# Patient Record
Sex: Female | Born: 1980 | Race: White | Hispanic: No | Marital: Married | Smoking: Never smoker
Health system: Southern US, Community
[De-identification: ages and names within clinical notes are randomized; demographics above are authoritative.]

## PROBLEM LIST (undated history)

## (undated) DIAGNOSIS — F5104 Psychophysiologic insomnia: Secondary | ICD-10-CM

## (undated) DIAGNOSIS — J45909 Unspecified asthma, uncomplicated: Secondary | ICD-10-CM

## (undated) DIAGNOSIS — G43019 Migraine without aura, intractable, without status migrainosus: Secondary | ICD-10-CM

## (undated) DIAGNOSIS — F329 Major depressive disorder, single episode, unspecified: Secondary | ICD-10-CM

## (undated) DIAGNOSIS — F32A Depression, unspecified: Secondary | ICD-10-CM

## (undated) DIAGNOSIS — E7800 Pure hypercholesterolemia, unspecified: Secondary | ICD-10-CM

## (undated) DIAGNOSIS — B009 Herpesviral infection, unspecified: Secondary | ICD-10-CM

## (undated) DIAGNOSIS — E039 Hypothyroidism, unspecified: Secondary | ICD-10-CM

## (undated) HISTORY — PX: DILATION AND CURETTAGE OF UTERUS: SHX78

## (undated) HISTORY — DX: Psychophysiologic insomnia: F51.04

## (undated) HISTORY — DX: Herpesviral infection, unspecified: B00.9

## (undated) HISTORY — DX: Major depressive disorder, single episode, unspecified: F32.9

## (undated) HISTORY — DX: Unspecified asthma, uncomplicated: J45.909

## (undated) HISTORY — DX: Depression, unspecified: F32.A

## (undated) HISTORY — DX: Pure hypercholesterolemia, unspecified: E78.00

## (undated) HISTORY — DX: Migraine without aura, intractable, without status migrainosus: G43.019

## (undated) HISTORY — PX: OTHER SURGICAL HISTORY: SHX169

## (undated) HISTORY — DX: Hypothyroidism, unspecified: E03.9

---

## 2016-01-30 DIAGNOSIS — Z79899 Other long term (current) drug therapy: Secondary | ICD-10-CM | POA: Diagnosis not present

## 2016-01-30 DIAGNOSIS — B009 Herpesviral infection, unspecified: Secondary | ICD-10-CM | POA: Diagnosis not present

## 2016-01-30 DIAGNOSIS — O9852 Other viral diseases complicating childbirth: Secondary | ICD-10-CM | POA: Diagnosis not present

## 2016-01-30 DIAGNOSIS — Z3483 Encounter for supervision of other normal pregnancy, third trimester: Secondary | ICD-10-CM | POA: Diagnosis not present

## 2016-01-30 DIAGNOSIS — Z3A41 41 weeks gestation of pregnancy: Secondary | ICD-10-CM | POA: Diagnosis not present

## 2016-01-30 DIAGNOSIS — N898 Other specified noninflammatory disorders of vagina: Secondary | ICD-10-CM | POA: Diagnosis not present

## 2016-01-31 DIAGNOSIS — Z3A41 41 weeks gestation of pregnancy: Secondary | ICD-10-CM | POA: Diagnosis not present

## 2016-04-19 DIAGNOSIS — R300 Dysuria: Secondary | ICD-10-CM | POA: Diagnosis not present

## 2016-04-19 DIAGNOSIS — M2550 Pain in unspecified joint: Secondary | ICD-10-CM | POA: Diagnosis not present

## 2016-04-19 DIAGNOSIS — E039 Hypothyroidism, unspecified: Secondary | ICD-10-CM | POA: Diagnosis not present

## 2016-04-19 DIAGNOSIS — N898 Other specified noninflammatory disorders of vagina: Secondary | ICD-10-CM | POA: Diagnosis not present

## 2016-10-02 DIAGNOSIS — J329 Chronic sinusitis, unspecified: Secondary | ICD-10-CM | POA: Diagnosis not present

## 2017-03-13 DIAGNOSIS — L700 Acne vulgaris: Secondary | ICD-10-CM | POA: Diagnosis not present

## 2017-05-21 DIAGNOSIS — R0789 Other chest pain: Secondary | ICD-10-CM | POA: Diagnosis not present

## 2017-05-21 DIAGNOSIS — R079 Chest pain, unspecified: Secondary | ICD-10-CM | POA: Diagnosis not present

## 2017-05-29 DIAGNOSIS — Z6831 Body mass index (BMI) 31.0-31.9, adult: Secondary | ICD-10-CM | POA: Diagnosis not present

## 2017-05-29 DIAGNOSIS — N200 Calculus of kidney: Secondary | ICD-10-CM | POA: Diagnosis not present

## 2017-05-29 DIAGNOSIS — L219 Seborrheic dermatitis, unspecified: Secondary | ICD-10-CM | POA: Diagnosis not present

## 2017-06-03 DIAGNOSIS — M549 Dorsalgia, unspecified: Secondary | ICD-10-CM | POA: Diagnosis not present

## 2017-06-03 DIAGNOSIS — N200 Calculus of kidney: Secondary | ICD-10-CM | POA: Diagnosis not present

## 2017-06-03 DIAGNOSIS — N898 Other specified noninflammatory disorders of vagina: Secondary | ICD-10-CM | POA: Diagnosis not present

## 2018-07-20 DIAGNOSIS — Z1331 Encounter for screening for depression: Secondary | ICD-10-CM | POA: Diagnosis not present

## 2018-07-20 DIAGNOSIS — Z1339 Encounter for screening examination for other mental health and behavioral disorders: Secondary | ICD-10-CM | POA: Diagnosis not present

## 2018-07-20 DIAGNOSIS — Z23 Encounter for immunization: Secondary | ICD-10-CM | POA: Diagnosis not present

## 2018-07-20 DIAGNOSIS — R51 Headache: Secondary | ICD-10-CM | POA: Diagnosis not present

## 2018-07-29 DIAGNOSIS — R51 Headache: Secondary | ICD-10-CM | POA: Diagnosis not present

## 2018-07-29 DIAGNOSIS — H538 Other visual disturbances: Secondary | ICD-10-CM | POA: Diagnosis not present

## 2018-09-23 ENCOUNTER — Encounter: Payer: Self-pay | Admitting: Neurology

## 2018-09-23 ENCOUNTER — Ambulatory Visit: Payer: BLUE CROSS/BLUE SHIELD | Admitting: Neurology

## 2018-09-23 VITALS — BP 129/90 | HR 97 | Ht 66.0 in | Wt 207.0 lb

## 2018-09-23 DIAGNOSIS — G43019 Migraine without aura, intractable, without status migrainosus: Secondary | ICD-10-CM | POA: Insufficient documentation

## 2018-09-23 DIAGNOSIS — R9089 Other abnormal findings on diagnostic imaging of central nervous system: Secondary | ICD-10-CM | POA: Diagnosis not present

## 2018-09-23 DIAGNOSIS — E039 Hypothyroidism, unspecified: Secondary | ICD-10-CM

## 2018-09-23 DIAGNOSIS — F5104 Psychophysiologic insomnia: Secondary | ICD-10-CM | POA: Insufficient documentation

## 2018-09-23 HISTORY — DX: Psychophysiologic insomnia: F51.04

## 2018-09-23 HISTORY — DX: Migraine without aura, intractable, without status migrainosus: G43.019

## 2018-09-23 MED ORDER — NORTRIPTYLINE HCL 10 MG PO CAPS: ORAL_CAPSULE | ORAL | 3 refills | Status: DC

## 2018-09-23 NOTE — Progress Notes (Signed)
Reason for visit: Headache, abnormal MRI brain  Referring physician: Dr. Delaney MeigsHolt  Crystal Heath is a 38 y.o. female  History of present illness:  Crystal Heath is a 38 year old right-handed white female with a history of headaches that began in her 8220s.  The patient has had occasional headaches occurring once or twice a week, but in August 2019 her headaches increased in frequency and now are daily in nature.  The headaches are frontotemporal in nature associated with a pressure sensation, she may also have some neck discomfort.  The patient does report some phonophobia, but not much photophobia.  She occasionally will have nausea when the headache is severe.  She does not know of any particular activating factors.  Her mother and her brother also have a history of headache.  The patient does note some blurring of vision, she denies any numbness or weakness of the extremities or the face or body.  She denies any balance problems or difficulty controlling the bowels or the bladder.  The patient drinks 1 or 2 cups of coffee daily, she may have up to 4 caffeinated soft drinks daily, and she may occasionally drink tea.  The patient has chronic insomnia, this has continued.  The patient does have fatigue during the day.  MRI of the brain was done and showed widespread white matter changes that were nonspecific in nature.  The patient is sent to this office for an evaluation.  The disc of the MRI is not available for my review.  This [facility].  Past Medical History:  Diagnosis Date  . Asthma   . Depression   . High cholesterol   . HSV-2 infection   . Hypothyroidism     Past Surgical History:  Procedure Laterality Date  . DILATION AND CURETTAGE OF UTERUS    . episiotomy repair      Family History  Problem Relation Age of Onset  . Skin cancer Mother   . Hypertension Mother   . High Cholesterol Mother   . High blood pressure Father   . High Cholesterol Father   .  Congestive Heart Failure Maternal Grandmother   . Breast cancer Paternal Grandmother   . Alzheimer's disease Paternal Grandmother     Social history:  reports that she has never smoked. She has never used smokeless tobacco. She reports current alcohol use. She reports that she does not use drugs.  Medications:  Prior to Admission medications   Medication Sig Start Date End Date Taking? Authorizing Provider  levothyroxine (SYNTHROID, LEVOTHROID) 112 MCG tablet Take by mouth. 01/16/16  Yes [provider]  valACYclovir (VALTREX) 500 MG tablet Take 500 mg by mouth 2 (two) times daily.   Yes [provider]     No Known Allergies  ROS:  Out of a complete 14 system review of symptoms, the patient complains only of the following symptoms, and all other reviewed systems are negative.  Fatigue Chest pain Ringing in the ears, dizziness Blurred vision Confusion, headache, weakness Depression, anxiety, decreased energy, disinterest in activities, suicidal thoughts, racing thoughts Insomnia, sleepiness  Blood pressure 129/90, pulse 97, height 5\' 6"  (1.676 m), weight 207 lb (93.9 kg).  Physical Exam  General: The patient is alert and cooperative at the time of the examination.  The patient is moderately obese.  Eyes: Pupils are equal, round, and reactive to light. Discs are flat bilaterally.  Neck: The neck is supple, no carotid bruits are noted.  Respiratory: The respiratory  examination is clear.  Cardiovascular: The cardiovascular examination reveals a regular rate and rhythm, no obvious murmurs or rubs are noted.  Neuromuscular: Range of movement of the cervical spine is full.  No crepitus is noted in the temporomandibular joints.  Skin: Extremities are without significant edema.  Neurologic Exam  Mental status: The patient is alert and oriented x 3 at the time of the examination. The patient has apparent normal recent and remote memory, with an apparently  normal attention span and concentration ability.  Cranial nerves: Facial symmetry is present. There is good sensation of the face to pinprick and soft touch bilaterally. The strength of the facial muscles and the muscles to head turning and shoulder shrug are normal bilaterally. Speech is well enunciated, no aphasia or dysarthria is noted. Extraocular movements are full. Visual fields are full. The tongue is midline, and the patient has symmetric elevation of the soft palate. No obvious hearing deficits are noted.  Motor: The motor testing reveals 5 over 5 strength of all 4 extremities. Good symmetric motor tone is noted throughout.  Sensory: Sensory testing is intact to pinprick, soft touch, vibration sensation, and position sense on all 4 extremities. No evidence of extinction is noted.  Coordination: Cerebellar testing reveals good finger-nose-finger and heel-to-shin bilaterally.  Gait and station: Gait is normal. Tandem gait is normal. Romberg is negative. No drift is seen.  Reflexes: Deep tendon reflexes are symmetric and normal bilaterally. Toes are downgoing bilaterally.   Assessment/Plan:  1.  Chronic daily headache  The patient indicates that she stopped her thyroid medication about 1 year ago, we will need to check blood work today to evaluate this.  The patient may have converted migraine or a mixture of migraine and muscle tension headache.  The patient will be started on nortriptyline at night, working up to 30 mg daily dose.  The MRI of the brain was abnormal, I will need to get a disc of the scan for my review.  The patient will follow-up in about 4 months.  She will call for any dose adjustments of her medication.  She takes Tylenol or Advil for the headache which seems to be helpful.   Marlan Palau MD 09/23/2018 2:06 PM  [facility] [address] [place], [place] [address]  Phone [phone] Fax [phone]

## 2018-09-25 LAB — COMPREHENSIVE METABOLIC PANEL
ALT: 6 IU/L (ref 0–32)
AST: 15 IU/L (ref 0–40)
Albumin/Globulin Ratio: 1.4 (ref 1.2–2.2)
Albumin: 4.1 g/dL (ref 3.8–4.8)
Alkaline Phosphatase: 82 IU/L (ref 39–117)
BUN / CREAT RATIO: 13 (ref 9–23)
BUN: 11 mg/dL (ref 6–20)
Bilirubin Total: 0.2 mg/dL (ref 0.0–1.2)
CALCIUM: 9.8 mg/dL (ref 8.7–10.2)
CO2: 23 mmol/L (ref 20–29)
Chloride: 101 mmol/L (ref 96–106)
Creatinine, Ser: 0.82 mg/dL (ref 0.57–1.00)
GFR calc non Af Amer: 92 mL/min/{1.73_m2} (ref >59)
GFR, EST AFRICAN AMERICAN: 106 mL/min/{1.73_m2} (ref >59)
Globulin, Total: 3 g/dL (ref 1.5–4.5)
Glucose: 87 mg/dL (ref 65–99)
Potassium: 4.3 mmol/L (ref 3.5–5.2)
Sodium: 139 mmol/L (ref 134–144)
TOTAL PROTEIN: 7.1 g/dL (ref 6.0–8.5)

## 2018-09-25 LAB — SEDIMENTATION RATE: Sed Rate: 7 mm/hr (ref 0–32)

## 2018-09-25 LAB — CBC WITH DIFFERENTIAL/PLATELET
BASOS: 1 %
Basophils Absolute: 0.1 10*3/uL (ref 0.0–0.2)
EOS (ABSOLUTE): 0.2 10*3/uL (ref 0.0–0.4)
Eos: 2 %
Hematocrit: 39.5 % (ref 34.0–46.6)
Hemoglobin: 13.1 g/dL (ref 11.1–15.9)
Immature Grans (Abs): 0 10*3/uL (ref 0.0–0.1)
Immature Granulocytes: 0 %
LYMPHS ABS: 2.9 10*3/uL (ref 0.7–3.1)
Lymphs: 29 %
MCH: 28 pg (ref 26.6–33.0)
MCHC: 33.2 g/dL (ref 31.5–35.7)
MCV: 84 fL (ref 79–97)
Monocytes Absolute: 0.7 10*3/uL (ref 0.1–0.9)
Monocytes: 7 %
Neutrophils Absolute: 6.3 10*3/uL (ref 1.4–7.0)
Neutrophils: 61 %
Platelets: 335 10*3/uL (ref 150–450)
RBC: 4.68 x10E6/uL (ref 3.77–5.28)
RDW: 13.5 % (ref 11.7–15.4)
WBC: 10.1 10*3/uL (ref 3.4–10.8)

## 2018-09-25 LAB — RPR: RPR Ser Ql: NONREACTIVE

## 2018-09-25 LAB — TSH: TSH: 2.37 u[IU]/mL (ref 0.450–4.500)

## 2018-09-25 LAB — HIV ANTIBODY (ROUTINE TESTING W REFLEX): HIV Screen 4th Generation wRfx: NONREACTIVE

## 2018-09-25 LAB — ANGIOTENSIN CONVERTING ENZYME: Angio Convert Enzyme: 29 U/L (ref 14–82)

## 2018-09-25 LAB — PAN-ANCA
ANCA Proteinase 3: <3.5 U/mL (ref 0.0–3.5)
Atypical pANCA: <1:20 {titer}
C-ANCA: <1:20 {titer}
Myeloperoxidase Ab: <9 U/mL (ref 0.0–9.0)
P-ANCA: <1:20 {titer}

## 2018-09-25 LAB — B. BURGDORFI ANTIBODIES: Lyme IgG/IgM Ab: <0.91 {ISR} (ref 0.00–0.90)

## 2018-09-25 LAB — ANA W/REFLEX: Anti Nuclear Antibody(ANA): NEGATIVE

## 2018-09-29 ENCOUNTER — Telehealth: Payer: Self-pay

## 2018-09-29 NOTE — Telephone Encounter (Signed)
I contacted the pt and was able to review lab results per Dr. Anne Hahn. Pt verbalized understanding and requested TSH results to be fwd to PCP (Dr. Leonor Liv at [facility]) TSH result faxed to [phone]. Confirmation received.

## 2018-09-29 NOTE — Telephone Encounter (Signed)
-----   Message from York Spaniel, MD sent at 09/26/2018  9:47 AM EST -----  The blood work results are unremarkable. Please call the patient. ----- Message ----- From: Nell Range Lab Results In Sent: 09/24/2018   7:38 AM EST To: York Spaniel, MD

## 2018-10-21 ENCOUNTER — Encounter: Payer: Self-pay | Admitting: Neurology

## 2018-12-22 ENCOUNTER — Telehealth: Payer: Self-pay

## 2018-12-22 NOTE — Telephone Encounter (Signed)
Unable to get in contact with the patient to offer her a sooner appt with Margie Ege, NP for 12/23/2018. I left a voicemail asking her to return my call. Office number was provided.

## 2018-12-31 ENCOUNTER — Telehealth: Payer: Self-pay

## 2018-12-31 NOTE — Telephone Encounter (Signed)
Unable to get in contact with the patient to offer her a sooner appt with Maralyn Sago. I left a voicemail asking her to return my call. Office number was provided.  If patient calls back please offer her an appt for the week of May 11-14. Maralyn Sago will be out of the office the last week of this month.

## 2019-01-19 ENCOUNTER — Ambulatory Visit: Payer: BLUE CROSS/BLUE SHIELD | Admitting: Neurology

## 2019-01-22 ENCOUNTER — Ambulatory Visit: Payer: BLUE CROSS/BLUE SHIELD | Admitting: Neurology

## 2019-02-03 ENCOUNTER — Telehealth: Payer: Self-pay | Admitting: *Deleted

## 2019-02-03 ENCOUNTER — Other Ambulatory Visit: Payer: Self-pay | Admitting: Neurology

## 2019-02-03 NOTE — Telephone Encounter (Signed)
Due to current COVID 19 pandemic, our office is severely reducing in office visits until further notice, in order to minimize the risk to our patients and healthcare providers. Unable to get in contact with the patient to convert their office visit with Sarah on 02/08/2019 into a doxy.me visit. I left a voicemail asking the patient to return my call. Office number was provided.     If patient calls back please convert their office visit into a doxy.me visit.      

## 2019-02-05 NOTE — Telephone Encounter (Signed)
Pt returned call and consented to a Virtual Visit. Link was sent to [phone] [employer]  Pt understands that although there may be some limitations with this type of visit, we will take all precautions to reduce any security or privacy concerns.  Pt understands that this will be treated like an in office visit and we will file with pt's insurance, and there may be a patient responsible charge related to this service.

## 2019-02-07 NOTE — Progress Notes (Signed)
Virtual Visit via Video Note  I connected with Crystal Heath on 02/08/19 at  8:15 AM EDT by a video enabled telemedicine application and verified that I am speaking with the correct person using two identifiers.  Location: Patient: At her home  Provider: In the office    I discussed the limitations of evaluation and management by telemedicine and the availability of in person appointments. The patient expressed understanding and agreed to proceed.  History of Present Illness: 02/07/2019 SS: [name] is a 38 year old female with history of migraine headaches and abnormal MRI of the brain.  She is currently taking nortriptyline 30 mg at bedtime.  She had [facility] showing widespread white matter changes, nonspecific in nature, in November 2019.  She reports good improvement in her headaches, more than 80%.  She reports since her last visit in January 2020, she has only had 4-5 headaches.  She describes them as frontotemporal in location, she will take 2 Tylenol with relief.  She indicates she has stopped taking her Synthroid.  She says over the last several weeks she has made lifestyle changes to include, eating healthier, consuming less caffeine, drinking more water, exercising daily.  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   09/23/2018 Dr. Anne HahnWillis : [name] is a 38 year old right-handed white female with a history of headaches that began in her 1820s.  The patient has had occasional headaches occurring once or twice a week, but in August 2019 her headaches increased in frequency and now are daily in nature.  The headaches are frontotemporal in nature associated with a pressure sensation, she may also have some neck discomfort.  The patient does report some phonophobia, but not much photophobia.  She occasionally will have nausea when the headache is severe.  She does not know of any particular activating factors.  Her mother and her brother also have a  history of headache.  The patient does note some blurring of vision, she denies any numbness or weakness of the extremities or the face or body.  She denies any balance problems or difficulty controlling the bowels or the bladder.  The patient drinks 1 or 2 cups of coffee daily, she may have up to 4 caffeinated soft drinks daily, and she may occasionally drink tea.  The patient has chronic insomnia, this has continued.  The patient does have fatigue during the day. MRI of the brain was done and showed widespread white matter changes that were nonspecific in nature.  The patient is sent to this office for an evaluation.  The disc of the MRI is not available for my review.  This was done through [facility].   Observations/Objective: Alert, answers questions appropriately, follows commands, speech is clear and concise, facial symmetry noted, symmetric eye movements, no arm drift, gait is intact  Assessment and Plan: 1.  Migraine headaches 2.  Abnormal MRI of the brain in November 2019  She indicates excellent control of her headaches, has only had 4-5 headaches in the last several months.  She would like to try to wean off nortriptyline.  She has made several lifestyle changes, she wonders if this may improve her headache control without medications.  Over the next several weeks, she will slowly decrease her nortriptyline dose.  If her headaches return, she can certainly restart her nortriptyline 30 mg at bedtime.  I will try to get the disc of her MRI of the brain that was done  in November 2019 at [facility] for Dr. Tobey Grim review.  I have suggested she contact her PCP for ongoing management of hypothyroidism, her TSH in January 2020 was WNL, she has stopped Synthroid.   Follow Up Instructions: 6 months 08/11/2019 9:15 am   I discussed the assessment and treatment plan with the patient. The patient was provided an opportunity to ask questions and all were answered. The patient agreed with  the plan and demonstrated an understanding of the instructions.   The patient was advised to call back or seek an in-person evaluation if the symptoms worsen or if the condition fails to improve as anticipated.  I provided 15 minutes of non-face-to-face time during this encounter.   Evangeline Dakin, DNP  [facility] [address], [place], [place] [address] [phone]

## 2019-02-08 ENCOUNTER — Ambulatory Visit (INDEPENDENT_AMBULATORY_CARE_PROVIDER_SITE_OTHER): Payer: BC Managed Care – PPO | Admitting: Neurology

## 2019-02-08 ENCOUNTER — Encounter: Payer: Self-pay | Admitting: Neurology

## 2019-02-08 ENCOUNTER — Other Ambulatory Visit: Payer: Self-pay

## 2019-02-08 DIAGNOSIS — G43019 Migraine without aura, intractable, without status migrainosus: Secondary | ICD-10-CM | POA: Diagnosis not present

## 2019-02-08 DIAGNOSIS — R9089 Other abnormal findings on diagnostic imaging of central nervous system: Secondary | ICD-10-CM | POA: Diagnosis not present

## 2019-02-08 NOTE — Telephone Encounter (Signed)
Noted  

## 2019-02-08 NOTE — Progress Notes (Signed)
I have read the note, and I agree with the clinical assessment and plan.  Veroncia Jezek K Orva Riles   

## 2019-03-10 DIAGNOSIS — Z13220 Encounter for screening for lipoid disorders: Secondary | ICD-10-CM | POA: Diagnosis not present

## 2019-03-10 DIAGNOSIS — Z6828 Body mass index (BMI) 28.0-28.9, adult: Secondary | ICD-10-CM | POA: Diagnosis not present

## 2019-03-10 DIAGNOSIS — Z1331 Encounter for screening for depression: Secondary | ICD-10-CM | POA: Diagnosis not present

## 2019-03-10 DIAGNOSIS — Z0000 Encounter for general adult medical examination without abnormal findings: Secondary | ICD-10-CM | POA: Diagnosis not present

## 2019-03-10 DIAGNOSIS — E079 Disorder of thyroid, unspecified: Secondary | ICD-10-CM | POA: Diagnosis not present

## 2019-03-11 DIAGNOSIS — E079 Disorder of thyroid, unspecified: Secondary | ICD-10-CM | POA: Diagnosis not present

## 2019-07-17 ENCOUNTER — Other Ambulatory Visit: Payer: Self-pay | Admitting: Neurology

## 2019-07-19 ENCOUNTER — Other Ambulatory Visit: Payer: Self-pay

## 2019-07-19 MED ORDER — NORTRIPTYLINE HCL 10 MG PO CAPS: ORAL_CAPSULE | ORAL | 0 refills | Status: DC

## 2019-08-11 ENCOUNTER — Ambulatory Visit: Payer: BC Managed Care – PPO | Admitting: Neurology

## 2019-08-11 ENCOUNTER — Encounter: Payer: Self-pay | Admitting: Neurology

## 2019-08-11 ENCOUNTER — Other Ambulatory Visit: Payer: Self-pay

## 2019-08-11 VITALS — BP 115/70 | HR 99 | Temp 97.3°F | Ht 67.0 in | Wt 193.8 lb

## 2019-08-11 DIAGNOSIS — G43019 Migraine without aura, intractable, without status migrainosus: Secondary | ICD-10-CM | POA: Diagnosis not present

## 2019-08-11 DIAGNOSIS — R9089 Other abnormal findings on diagnostic imaging of central nervous system: Secondary | ICD-10-CM | POA: Diagnosis not present

## 2019-08-11 MED ORDER — NORTRIPTYLINE HCL 10 MG PO CAPS: ORAL_CAPSULE | ORAL | 11 refills | Status: DC

## 2019-08-11 NOTE — Patient Instructions (Signed)
Continue the nortriptyline 30 mg at bedtime   Please try to drink more water, healthy eating, exercise for migraine prevention   I will try to get the disc for the MRI   Return in 6 months

## 2019-08-11 NOTE — Progress Notes (Signed)
PATIENT: Crystal BurgessStacy Ferrall DOB: 1980-09-22  REASON FOR VISIT: follow up HISTORY FROM: patient  HISTORY OF PRESENT ILLNESS: Today 08/11/19  08/11/2019 SS: Crystal Heath is a 38 year old female with history of migraine headache and abnormal MRI of the brain.  She had [facility] showed widespread white matter changes, nonspecific in nature in November 2019.  She reports she tried to taper off nortriptyline, but had a recurrence of headache.  She is not sure if this was related to a dose reduction, or poor life style habits.  Nonetheless, she has resumed nortriptyline 30 mg at bedtime.  She admits she is not drinking enough water, or making good eating choices, she is drinking a lot of caffeine and soda.  She reports 2-3 headaches a week, she is able to continue her activity.  She says she may take ibuprofen once a week for headache, with relief.  She continues to complain of some word finding difficulty, mild confusion at times.  She had follow-up with her primary doctor, is now taking Synthroid.  She indicates overall her health has been good.  She has seen her eye doctor, told her exam was normal.  She presents today for evaluation unaccompanied.  HISTORY  02/07/2019 SS: Crystal Heath is a 38 year old female with history of migraine headaches and abnormal MRI of the brain.  She is currently taking nortriptyline 30 mg at bedtime.  She had [facility] showing widespread white matter changes, nonspecific in nature, in November 2019.  She reports good improvement in her headaches, more than 80%.  She reports since her last visit in January 2020, she has only had 4-5 headaches.  She describes them as frontotemporal in location, she will take 2 Tylenol with relief.  She indicates she has stopped taking her Synthroid.  She says over the last several weeks she has made lifestyle changes to include, eating healthier, consuming less caffeine, drinking more  water, exercising daily.  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   REVIEW OF SYSTEMS: Out of a complete 14 system review of symptoms, the patient complains only of the following symptoms, and all other reviewed systems are negative.  Headache  ALLERGIES: No Known Allergies  HOME MEDICATIONS: Outpatient Medications Prior to Visit  Medication Sig Dispense Refill  . levothyroxine (SYNTHROID) 50 MCG tablet Take 50 mcg by mouth daily before breakfast.    . valACYclovir (VALTREX) 500 MG tablet Take 500 mg by mouth 2 (two) times daily.    . nortriptyline (PAMELOR) 10 MG capsule 3 CAPSULES BY MOUTH AT NIGHT 90 capsule 0  . levothyroxine (SYNTHROID, LEVOTHROID) 112 MCG tablet Take by mouth.     No facility-administered medications prior to visit.    PAST MEDICAL HISTORY: Past Medical History:  Diagnosis Date  . Asthma   . Chronic insomnia 09/23/2018  . Common migraine with intractable migraine 09/23/2018  . Depression   . High cholesterol   . HSV-2 infection   . Hypothyroidism     PAST SURGICAL HISTORY: Past Surgical History:  Procedure Laterality Date  . DILATION AND CURETTAGE OF UTERUS    . episiotomy repair      FAMILY HISTORY: Family History  Problem Relation Age of Onset  . Skin cancer Mother   . Hypertension Mother   . High Cholesterol Mother   . High blood pressure Father   . High Cholesterol Father   . Congestive Heart Failure Maternal Grandmother   .  Breast cancer Paternal Grandmother   . Alzheimer's disease Paternal Grandmother     SOCIAL HISTORY: Social History   Socioeconomic History  . Marital status: Married    Spouse name: Not on file  . Number of children: Not on file  . Years of education: Not on file  . Highest education level: Not on file  Occupational History  . Not on file  Tobacco Use  . Smoking status: Never Smoker  . Smokeless tobacco: Never Used  Substance and Sexual Activity  . Alcohol use: Yes    Comment: 1-2 per drinks per  week  . Drug use: Never  . Sexual activity: Not on file  Other Topics Concern  . Not on file  Social History Narrative   1-2 cups of caffeine daily    Lives at home with spouse and three children    Right handed    Social Determinants of Health   Financial Resource Strain:   . Difficulty of Paying Living Expenses: Not on file  Food Insecurity:   . Worried About Programme researcher, broadcasting/film/video in the Last Year: Not on file  . Ran Out of Food in the Last Year: Not on file  Transportation Needs:   . Lack of Transportation (Medical): Not on file  . Lack of Transportation (Non-Medical): Not on file  Physical Activity:   . Days of Exercise per Week: Not on file  . Minutes of Exercise per Session: Not on file  Stress:   . Feeling of Stress : Not on file  Social Connections:   . Frequency of Communication with Friends and Family: Not on file  . Frequency of Social Gatherings with Friends and Family: Not on file  . Attends Religious Services: Not on file  . Active Member of Clubs or Organizations: Not on file  . Attends Banker Meetings: Not on file  . Marital Status: Not on file  Intimate Partner Violence:   . Fear of Current or Ex-Partner: Not on file  . Emotionally Abused: Not on file  . Physically Abused: Not on file  . Sexually Abused: Not on file   PHYSICAL EXAM  Vitals:   08/11/19 1338  BP: 115/70  Pulse: 99  Temp: (!) 97.3 F (36.3 C)  Weight: 193 lb 12.8 oz (87.9 kg)  Height: 5\' 7"  (1.702 m)   Body mass index is 30.35 kg/m.  Generalized: Well developed, in no acute distress   Neurological examination  Mentation: Alert oriented to time, place, history taking. Follows all commands speech and language fluent Cranial nerve II-XII: Pupils were equal round reactive to light. Extraocular movements were full, visual field were full on confrontational test. Facial sensation and strength were normal. Head turning and shoulder shrug  were normal and symmetric. Motor:  The motor testing reveals 5 over 5 strength of all 4 extremities. Good symmetric motor tone is noted throughout.  Sensory: Sensory testing is intact to soft touch on all 4 extremities. No evidence of extinction is noted.  Coordination: Cerebellar testing reveals good finger-nose-finger and heel-to-shin bilaterally.  Gait and station: Gait is normal. Tandem gait is normal. Romberg is negative. No drift is seen.  Reflexes: Deep tendon reflexes are symmetric and normal bilaterally.   DIAGNOSTIC DATA (LABS, IMAGING, TESTING) - I reviewed patient records, labs, notes, testing and imaging myself where available.  Lab Results  Component Value Date   WBC 10.1 09/23/2018   HGB 13.1 09/23/2018   HCT 39.5 09/23/2018   MCV 84 09/23/2018  PLT 335 09/23/2018      Component Value Date/Time   NA 139 09/23/2018 1431   K 4.3 09/23/2018 1431   CL 101 09/23/2018 1431   CO2 23 09/23/2018 1431   GLUCOSE 87 09/23/2018 1431   BUN 11 09/23/2018 1431   CREATININE 0.82 09/23/2018 1431   CALCIUM 9.8 09/23/2018 1431   PROT 7.1 09/23/2018 1431   ALBUMIN 4.1 09/23/2018 1431   AST 15 09/23/2018 1431   ALT 6 09/23/2018 1431   ALKPHOS 82 09/23/2018 1431   BILITOT 0.2 09/23/2018 1431   GFRNONAA 92 09/23/2018 1431   GFRAA 106 09/23/2018 1431   No results found for: CHOL, HDL, LDLCALC, LDLDIRECT, TRIG, CHOLHDL No results found for: HGBA1C No results found for: VITAMINB12 Lab Results  Component Value Date   TSH 2.370 09/23/2018   ASSESSMENT AND PLAN 38 y.o. year old female  has a past medical history of Asthma, Chronic insomnia (09/23/2018), Common migraine with intractable migraine (09/23/2018), Depression, High cholesterol, HSV-2 infection, and Hypothyroidism. here with:  1.  Migraine headache 2.  Abnormal MRI of the brain in November 2019  She has had a recurrence of her headache, along with poor life style habits.  She will remain on nortriptyline 30 mg at bedtime. She will try to drink plenty of  water, make healthy eating choices, and exercise.  She is to avoid excess amounts of caffeine. She has found that healthy lifestyle management has been very beneficial for her headaches.  I will again try to get the CD of her MRI of the brain at [facility] in November 2019 for Dr. Jannifer Franklin to review.  MRI of the brain report indicated widespread white matter changes, nonspecific in nature. She does complain of some general confusion, word finding difficulty that may be related.  She will follow-up in 6 months or sooner if needed.  I did advise if her symptoms worsen or she develops any new symptoms she should let us know.  Medical records has faxed a request to [facility] to have them mail [place] a CD of the MRI of the brain.   I spent 15 minutes with the patient. 50% of this time was spent discussing her plan of care.  Butler Denmark, AGNP-C, DNP 08/11/2019, 2:03 PM [facility] [address], [place], [place] [address] [phone]

## 2019-08-12 NOTE — Progress Notes (Signed)
I have read the note, and I agree with the clinical assessment and plan.  Charles K Willis   

## 2019-08-31 ENCOUNTER — Telehealth: Payer: Self-pay | Admitting: Neurology

## 2019-08-31 NOTE — Telephone Encounter (Signed)
I received MRI scan CD from [place] for Dr. Anne Hahn to review. I will place in his box for non-urgent review.

## 2019-09-06 NOTE — Telephone Encounter (Signed)
I have reviewed the disc of the MRI of the brain, it does show mild to moderate white matter changes in the cortical white matter, no involvement of the brainstem or cerebellum.  The findings are nonspecific, could be potentially related to history of migraine, should be followed over time to exclude other issues such as demyelinating disease.

## 2019-09-07 NOTE — Telephone Encounter (Signed)
I called the patient and left a message with MRI findings, will repeat every 2 years.

## 2019-09-07 NOTE — Telephone Encounter (Signed)
Repeating the study in 2 years is reasonable.

## 2019-12-22 DIAGNOSIS — Z01419 Encounter for gynecological examination (general) (routine) without abnormal findings: Secondary | ICD-10-CM | POA: Diagnosis not present

## 2019-12-22 DIAGNOSIS — N898 Other specified noninflammatory disorders of vagina: Secondary | ICD-10-CM | POA: Diagnosis not present

## 2020-01-06 DIAGNOSIS — N898 Other specified noninflammatory disorders of vagina: Secondary | ICD-10-CM | POA: Diagnosis not present

## 2020-02-09 ENCOUNTER — Ambulatory Visit: Payer: BC Managed Care – PPO | Admitting: Neurology

## 2020-03-16 DIAGNOSIS — Z6832 Body mass index (BMI) 32.0-32.9, adult: Secondary | ICD-10-CM | POA: Diagnosis not present

## 2020-03-16 DIAGNOSIS — Z13220 Encounter for screening for lipoid disorders: Secondary | ICD-10-CM | POA: Diagnosis not present

## 2020-03-16 DIAGNOSIS — Z1331 Encounter for screening for depression: Secondary | ICD-10-CM | POA: Diagnosis not present

## 2020-03-16 DIAGNOSIS — Z0000 Encounter for general adult medical examination without abnormal findings: Secondary | ICD-10-CM | POA: Diagnosis not present

## 2020-03-16 DIAGNOSIS — E079 Disorder of thyroid, unspecified: Secondary | ICD-10-CM | POA: Diagnosis not present

## 2020-03-27 ENCOUNTER — Encounter: Payer: Self-pay | Admitting: Neurology

## 2020-03-27 ENCOUNTER — Ambulatory Visit: Payer: BC Managed Care – PPO | Admitting: Neurology

## 2020-03-27 VITALS — BP 134/98 | HR 102 | Ht 67.0 in | Wt 207.8 lb

## 2020-03-27 DIAGNOSIS — G43019 Migraine without aura, intractable, without status migrainosus: Secondary | ICD-10-CM | POA: Diagnosis not present

## 2020-03-27 DIAGNOSIS — R9089 Other abnormal findings on diagnostic imaging of central nervous system: Secondary | ICD-10-CM

## 2020-03-27 MED ORDER — NORTRIPTYLINE HCL 10 MG PO CAPS: ORAL_CAPSULE | ORAL | 11 refills | Status: DC

## 2020-03-27 NOTE — Progress Notes (Signed)
I have read the note, and I agree with the clinical assessment and plan.  Jamaree Hosier K Luellen Howson   

## 2020-03-27 NOTE — Patient Instructions (Signed)
Will order carotid US Continue nortriptyline at current dosing Check MRI at next visit  Return in 6 months or sooner if needed

## 2020-03-27 NOTE — Progress Notes (Signed)
PATIENT: Crystal Heath DOB: [birth year]  REASON FOR VISIT: follow up HISTORY FROM: patient  HISTORY OF PRESENT ILLNESS: Today 03/27/20  Crystal Heath is a 39 year old female with history of migraine headache and abnormal MRI of the brain.  MRI of the brain at [facility] showed widespread white matter changes, nonspecific in nature November 2019, will repeat in 2 years.  She remains on nortriptyline.  Headaches are currently well controlled, on average 1/week.  Tolerating nortriptyline.  Complains of pressure in her head when pressure is applied to her neck, mostly on the right side, such as pressing on her neck or from pillow.  No dizziness.  Causes "pressure all over her head".  Has started cholesterol medication. Tearful today, frustrated with herself for not taking better care of her health.  Presents today for follow-up unaccompanied.  HISTORY 08/11/2019 SS: [name] is a 39 year old female with history of migraine headache and abnormal MRI of the brain.  She had MRI of the brain done at [facility] showed widespread white matter changes, nonspecific in nature in November 2019.  She reports she tried to taper off nortriptyline, but had a recurrence of headache.  She is not sure if this was related to a dose reduction, or poor life style habits.  Nonetheless, she has resumed nortriptyline 30 mg at bedtime.  She admits she is not drinking enough water, or making good eating choices, she is drinking a lot of caffeine and soda.  She reports 2-3 headaches a week, she is able to continue her activity.  She says she may take ibuprofen once a week for headache, with relief.  She continues to complain of some word finding difficulty, mild confusion at times.  She had follow-up with her primary doctor, is now taking Synthroid.  She indicates overall her health has been good.  She has seen her eye doctor, told her exam was normal.  She presents today for evaluation unaccompanied.   REVIEW  OF SYSTEMS: Out of a complete 14 system review of symptoms, the patient complains only of the following symptoms, and all other reviewed systems are negative.  Headache  ALLERGIES: No Known Allergies  HOME MEDICATIONS: Outpatient Medications Prior to Visit  Medication Sig Dispense Refill   levothyroxine (SYNTHROID) 50 MCG tablet Take 50 mcg by mouth daily before breakfast.     rosuvastatin (CRESTOR) 10 MG tablet Take 10 mg by mouth at bedtime.     valACYclovir (VALTREX) 500 MG tablet Take 500 mg by mouth 2 (two) times daily.     nortriptyline (PAMELOR) 10 MG capsule 3 CAPSULES BY MOUTH AT NIGHT 90 capsule 11   No facility-administered medications prior to visit.    PAST MEDICAL HISTORY: Past Medical History:  Diagnosis Date   Asthma    Chronic insomnia 09/23/2018   Common migraine with intractable migraine 09/23/2018   Depression    High cholesterol    HSV-2 infection    Hypothyroidism     PAST SURGICAL HISTORY: Past Surgical History:  Procedure Laterality Date   DILATION AND CURETTAGE OF UTERUS     episiotomy repair      FAMILY HISTORY: Family History  Problem Relation Age of Onset   Skin cancer Mother    Hypertension Mother    High Cholesterol Mother    High blood pressure Father    High Cholesterol Father    Congestive Heart Failure Maternal Grandmother    Breast cancer Paternal Grandmother    Alzheimer's disease Paternal Grandmother  SOCIAL HISTORY: Social History   Socioeconomic History   Marital status: Married    Spouse name: Not on file   Number of children: Not on file   Years of education: Not on file   Highest education level: Not on file  Occupational History   Not on file  Tobacco Use   Smoking status: Never Smoker   Smokeless tobacco: Never Used  Substance and Sexual Activity   Alcohol use: Yes    Comment: 1-2 per drinks per week   Drug use: Never   Sexual activity: Not on file  Other Topics Concern    Not on file  Social History Narrative   1-2 cups of caffeine daily    Lives at home with spouse and three children    Right handed    Social Determinants of Health   Financial Resource Strain:    Difficulty of Paying Living Expenses:   Food Insecurity:    Worried About Programme researcher, broadcasting/film/video in the Last Year:    Barista in the Last Year:   Transportation Needs:    Freight forwarder (Medical):    Lack of Transportation (Non-Medical):   Physical Activity:    Days of Exercise per Week:    Minutes of Exercise per Session:   Stress:    Feeling of Stress :   Social Connections:    Frequency of Communication with Friends and Family:    Frequency of Social Gatherings with Friends and Family:    Attends Religious Services:    Active Member of Clubs or Organizations:    Attends Banker Meetings:    Marital Status:   Intimate Partner Violence:    Fear of Current or Ex-Partner:    Emotionally Abused:    Physically Abused:    Sexually Abused:    PHYSICAL EXAM  Vitals:   03/27/20 1337  BP: (!) 134/98  Pulse: (!) 102  Weight: 207 lb 12.8 oz (94.3 kg)  Height: 5\' 7"  (1.702 m)   Body mass index is 32.55 kg/m.  Generalized: Well developed, in no acute distress  No carotid bruits noted. Neurological examination  Mentation: Alert oriented to time, place, history taking. Follows all commands speech and language fluent Cranial nerve II-XII: Pupils were equal round reactive to light. Extraocular movements were full, visual field were full on confrontational test. Facial sensation and strength were normal.  Head turning and shoulder shrug  were normal and symmetric. Motor: The motor testing reveals 5 over 5 strength of all 4 extremities. Good symmetric motor tone is noted throughout.  Sensory: Sensory testing is intact to soft touch on all 4 extremities. No evidence of extinction is noted.  Coordination: Cerebellar testing reveals good  finger-nose-finger and heel-to-shin bilaterally.  Gait and station: Gait is normal. Tandem gait is normal. Romberg is negative. No drift is seen.  Reflexes: Deep tendon reflexes are symmetric and normal bilaterally.   DIAGNOSTIC DATA (LABS, IMAGING, TESTING) - I reviewed patient records, labs, notes, testing and imaging myself where available.  Lab Results  Component Value Date   WBC 10.1 09/23/2018   HGB 13.1 09/23/2018   HCT 39.5 09/23/2018   MCV 84 09/23/2018   PLT 335 09/23/2018      Component Value Date/Time   NA 139 09/23/2018 1431   K 4.3 09/23/2018 1431   CL 101 09/23/2018 1431   CO2 23 09/23/2018 1431   GLUCOSE 87 09/23/2018 1431   BUN 11 09/23/2018 1431   CREATININE  0.82 09/23/2018 1431   CALCIUM 9.8 09/23/2018 1431   PROT 7.1 09/23/2018 1431   ALBUMIN 4.1 09/23/2018 1431   AST 15 09/23/2018 1431   ALT 6 09/23/2018 1431   ALKPHOS 82 09/23/2018 1431   BILITOT 0.2 09/23/2018 1431   GFRNONAA 92 09/23/2018 1431   GFRAA 106 09/23/2018 1431   No results found for: CHOL, HDL, LDLCALC, LDLDIRECT, TRIG, CHOLHDL No results found for: RUEA5W No results found for: VITAMINB12 Lab Results  Component Value Date   TSH 2.370 09/23/2018      ASSESSMENT AND PLAN 39 y.o. year old female  has a past medical history of Asthma, Chronic insomnia (09/23/2018), Common migraine with intractable migraine (09/23/2018), Depression, High cholesterol, HSV-2 infection, and Hypothyroidism. here with:  1.  Migraine headache -Headaches well controlled -Continue nortriptyline 30 mg at bedtime  2.  Abnormal MRI of the brain in November 2019 -Diffuse white matter changes by MRI of the brain -Recheck in November 2021 (sent reminder) or next follow-up  3.  Neck pressure -Will order carotid ultrasound, no bruits on exam -Risk factors HTN, HLD -Encouraged her to establish a consistent exercise regimen, drink plenty of water, make healthy eating choices -Return in 6 months or sooner if  neede  I spent 30 minutes of face-to-face and non-face-to-face time with patient.  This included previsit chart review, lab review, study review, order entry, electronic health record documentation, patient education.  Margie Ege, AGNP-C, DNP 03/27/2020, 2:20 PM [facility] [address] [place], [place] [address] [phone]

## 2020-05-05 DIAGNOSIS — L219 Seborrheic dermatitis, unspecified: Secondary | ICD-10-CM | POA: Diagnosis not present

## 2020-05-05 DIAGNOSIS — D225 Melanocytic nevi of trunk: Secondary | ICD-10-CM | POA: Diagnosis not present

## 2020-05-05 DIAGNOSIS — D2372 Other benign neoplasm of skin of left lower limb, including hip: Secondary | ICD-10-CM | POA: Diagnosis not present

## 2020-05-05 DIAGNOSIS — L820 Inflamed seborrheic keratosis: Secondary | ICD-10-CM | POA: Diagnosis not present

## 2020-05-23 DIAGNOSIS — Z8249 Family history of ischemic heart disease and other diseases of the circulatory system: Secondary | ICD-10-CM | POA: Diagnosis not present

## 2020-05-23 DIAGNOSIS — R079 Chest pain, unspecified: Secondary | ICD-10-CM | POA: Diagnosis not present

## 2020-05-23 DIAGNOSIS — R002 Palpitations: Secondary | ICD-10-CM | POA: Diagnosis not present

## 2020-05-23 DIAGNOSIS — R030 Elevated blood-pressure reading, without diagnosis of hypertension: Secondary | ICD-10-CM | POA: Diagnosis not present

## 2020-05-23 DIAGNOSIS — E785 Hyperlipidemia, unspecified: Secondary | ICD-10-CM | POA: Diagnosis not present

## 2020-06-07 DIAGNOSIS — R079 Chest pain, unspecified: Secondary | ICD-10-CM | POA: Diagnosis not present

## 2020-06-15 DIAGNOSIS — R079 Chest pain, unspecified: Secondary | ICD-10-CM | POA: Diagnosis not present

## 2020-06-15 DIAGNOSIS — Z8249 Family history of ischemic heart disease and other diseases of the circulatory system: Secondary | ICD-10-CM | POA: Diagnosis not present

## 2020-06-19 DIAGNOSIS — R079 Chest pain, unspecified: Secondary | ICD-10-CM | POA: Diagnosis not present

## 2020-06-19 DIAGNOSIS — G43019 Migraine without aura, intractable, without status migrainosus: Secondary | ICD-10-CM | POA: Diagnosis not present

## 2020-06-19 DIAGNOSIS — F5104 Psychophysiologic insomnia: Secondary | ICD-10-CM | POA: Diagnosis not present

## 2020-06-19 DIAGNOSIS — E782 Mixed hyperlipidemia: Secondary | ICD-10-CM | POA: Diagnosis not present

## 2020-06-19 DIAGNOSIS — E039 Hypothyroidism, unspecified: Secondary | ICD-10-CM | POA: Diagnosis not present

## 2020-06-19 DIAGNOSIS — Z8249 Family history of ischemic heart disease and other diseases of the circulatory system: Secondary | ICD-10-CM | POA: Diagnosis not present

## 2020-06-22 DIAGNOSIS — R079 Chest pain, unspecified: Secondary | ICD-10-CM | POA: Diagnosis not present

## 2020-06-22 DIAGNOSIS — R002 Palpitations: Secondary | ICD-10-CM | POA: Diagnosis not present

## 2020-06-22 DIAGNOSIS — R030 Elevated blood-pressure reading, without diagnosis of hypertension: Secondary | ICD-10-CM | POA: Diagnosis not present

## 2020-06-22 DIAGNOSIS — E785 Hyperlipidemia, unspecified: Secondary | ICD-10-CM | POA: Diagnosis not present

## 2020-06-22 DIAGNOSIS — R071 Chest pain on breathing: Secondary | ICD-10-CM | POA: Diagnosis not present

## 2020-06-26 ENCOUNTER — Telehealth: Payer: Self-pay | Admitting: Neurology

## 2020-06-26 DIAGNOSIS — R519 Headache, unspecified: Secondary | ICD-10-CM

## 2020-06-26 NOTE — Telephone Encounter (Signed)
When last seen, we talked about repeating MRI of the brain around this time, check to see if she is ready. If so will order with contrast, check kidneys before contrast.  Dr. Anne Hahn looked at MRI of the brain without contrast disc in January 2021, showed mild to moderate white matter changes in the cortical white matter, no involvement of brainstem or cerebellum.  Findings are nonspecific, could be related history of migraines, will follow over time to exclude other issues such as demyelinating disease.

## 2020-06-28 DIAGNOSIS — R071 Chest pain on breathing: Secondary | ICD-10-CM | POA: Diagnosis not present

## 2020-06-28 NOTE — Telephone Encounter (Signed)
I placed the order for MRI brain with and without. I can see 06/19/20 CMP, creatinine was normal 0.70.

## 2020-06-28 NOTE — Telephone Encounter (Signed)
Pt. Called to follow up about MRI being scheduled. Please advise.   Best contact: [phone]

## 2020-06-28 NOTE — Telephone Encounter (Signed)
I called pt and she is willing to proceed with MRI as she has met her deductible this year.  She had new pcp visit with [facility], [phone] labs done 06-19-20 they will fax to me.

## 2020-06-28 NOTE — Addendum Note (Signed)
Addended by: Glean Salvo on: 06/28/2020 09:27 PM   Modules accepted: Orders

## 2020-06-30 DIAGNOSIS — R071 Chest pain on breathing: Secondary | ICD-10-CM | POA: Diagnosis not present

## 2020-07-03 ENCOUNTER — Telehealth: Payer: Self-pay | Admitting: Neurology

## 2020-07-03 NOTE — Telephone Encounter (Signed)
[employer] auth: NPR Ref # [id number] on 06/29/20 at 10:05 AM order sent to [facility]. They will reach out to the patient to schedule.

## 2020-07-06 ENCOUNTER — Other Ambulatory Visit: Payer: Self-pay

## 2020-07-06 ENCOUNTER — Ambulatory Visit
Admission: RE | Admit: 2020-07-06 | Discharge: 2020-07-06 | Disposition: A | Discharge status: Home / Self Care | Payer: BC Managed Care – PPO | Source: Ambulatory Visit | Attending: Neurology | Admitting: Neurology

## 2020-07-06 DIAGNOSIS — R519 Headache, unspecified: Secondary | ICD-10-CM | POA: Diagnosis not present

## 2020-07-06 MED ORDER — GADOBENATE DIMEGLUMINE 529 MG/ML IV SOLN
20.0000 mL | Freq: Once | INTRAVENOUS | Status: AC | PRN
  Administered 2020-07-06: 20 mL via INTRAVENOUS

## 2020-07-11 ENCOUNTER — Telehealth: Payer: Self-pay | Admitting: Neurology

## 2020-07-11 DIAGNOSIS — R9089 Other abnormal findings on diagnostic imaging of central nervous system: Secondary | ICD-10-CM

## 2020-07-11 NOTE — Telephone Encounter (Signed)
Please call, MRI of the brain shows more than would be expected for age white matter disease, discussed with Dr. Anne Hahn, will get a CT angiogram head and neck and 2D echo. Further evaluation is being done to investigate cardio embolic sources.   IMPRESSION: This MRI of the brain with and without contrast shows the following: 1.  There are multiple T2/flair hyperintense foci in the subcortical and deep white matter of the hemispheres.  This is a nonspecific finding and could be due to chronic microvascular ischemic change or the sequela of cardio-emboli, migraine, trauma, infection/inflammation/vasculitis.  Demyelination is unlikely to have this appearance.  Consider evaluation for PFO or other cardioembolic source. 2.  There are no acute findings and there is a normal enhancement pattern.

## 2020-07-11 NOTE — Telephone Encounter (Signed)
I relayed results of MRI brain more then expected white matter disease for age, nonspecific (could be number of cause gave her examples listed).  Per Dr. Anne Hahn, receommend CTA head and neck and 2Decho.  She is asking should she be concerned (has 3 Saranne Crislip children).  She has had work up [employer], cardiology EKG,Echo stress test.

## 2020-07-11 NOTE — Telephone Encounter (Signed)
BC[employer] auth: NPR Ref # [id number] order sent to [facility]. They will reach out to the patient to schedule.

## 2020-07-12 NOTE — Telephone Encounter (Signed)
I called pt and relayed that CTA head neck still scheduled.  Cancelled the Echo, since previous cardiac w/u done by Dr. Sanda Linger 06-19-20.  Pt asked if previous MRI showed stable or worsening WMD?  I see had MR report from [facility] 2019.  (I dont see images.  I did not see comparison on this MRI done.  Your thoughts?

## 2020-07-12 NOTE — Telephone Encounter (Signed)
Can cancel the Echo (I did this), go ahead with CTA head and neck.  Saw cardiology at [facility] 06/22/20 reviewed note Dr. Isabel Caprice -48 hr heart monitor was normal -Echo was normal left ventricular chamber size, wall thickness, systolic wall motion, EF 60-65% -Cardiac CTA, showed normal coronary arteries  -On Crestor 10 mg for dyslipidemia  -Cardiology work up was reassuring

## 2020-07-12 NOTE — Telephone Encounter (Signed)
I was able to find her 2019 MRI and compared to the 2021 MRI.  The extent of white matter change is unchanged between 2019 and 2021.

## 2020-07-12 NOTE — Telephone Encounter (Signed)
I called pt and relayed that per 2019 and 2021 no change in WMD.  She asked if she could wait and have the CTA head and neck next year (feb/march), due to her daughter having eye surgery and needing to catch up on finances.  I tried to get her in for appt, but you have no availability.  I placed on cancellation list.

## 2020-07-13 NOTE — Telephone Encounter (Signed)
I called and LMVM for pt that that per Maralyn Sago, NP that due to MRI show stability and cardiac work up unrevealing probable to and do as soon as  she is able.  She is to cal back if questions.

## 2020-08-01 ENCOUNTER — Ambulatory Visit: Payer: BC Managed Care – PPO | Admitting: Neurology

## 2020-08-01 ENCOUNTER — Other Ambulatory Visit: Payer: Self-pay | Admitting: Neurology

## 2020-08-01 ENCOUNTER — Encounter: Payer: Self-pay | Admitting: Neurology

## 2020-08-01 ENCOUNTER — Other Ambulatory Visit: Payer: Self-pay

## 2020-08-01 VITALS — BP 113/80 | HR 74 | Ht 67.0 in | Wt 203.0 lb

## 2020-08-01 DIAGNOSIS — R9089 Other abnormal findings on diagnostic imaging of central nervous system: Secondary | ICD-10-CM

## 2020-08-01 DIAGNOSIS — G43019 Migraine without aura, intractable, without status migrainosus: Secondary | ICD-10-CM | POA: Diagnosis not present

## 2020-08-01 NOTE — Progress Notes (Signed)
PATIENT: Crystal Heath DOB: 1981-02-28  REASON FOR VISIT: follow up HISTORY FROM: patient  HISTORY OF PRESENT ILLNESS: Today 08/01/20  [name] is a 39 year old female with history of migraine headache and abnormal MRI of the brain.  MRI of the brain November 2021 shows more than expected for age white matter disease, CT angiogram of head and neck, and 2D echo have been ordered, to undergo further evaluation of cardioembolic sources.  Has seen cardiology at [facility], 48-hour heart monitor was normal, echo showed normal left ventricular chamber size, wall thickness, systolic wall motion, EF 60 to 65%.  White matter disease was unchanged from 2019 and 2021. Headaches are well controlled, has tapered off nortriptyline.  After last visit in August, she started drinking more water, was exercising.  Unfortunately, she discontinued these habits, is looking to get back in.  Seeing a new PCP, taking trazodone now.  Will not be able to have CTA until early next year, due to finances.  Presents today for evaluation unaccompanied.  HISTORY 03/27/2020 SS: [name] is a 39 year old female with history of migraine headache and abnormal MRI of the brain.  MRI of the brain at [facility] showed widespread white matter changes, nonspecific in nature November 2019, will repeat in 2 years.  She remains on nortriptyline.  Headaches are currently well controlled, on average 1/week.  Tolerating nortriptyline.  Complains of pressure in her head when pressure is applied to her neck, mostly on the right side, such as pressing on her neck or from pillow.  No dizziness.  Causes "pressure all over her head".  Has started cholesterol medication. Tearful today, frustrated with herself for not taking better care of her health.  Presents today for follow-up unaccompanied.   REVIEW OF SYSTEMS: Out of a complete 14 system review of symptoms, the patient complains only of the following symptoms, and all other reviewed  systems are negative.  Headache  ALLERGIES: No Known Allergies  HOME MEDICATIONS: Outpatient Medications Prior to Visit  Medication Sig Dispense Refill  . levothyroxine (SYNTHROID) 50 MCG tablet Take 50 mcg by mouth daily before breakfast.    . rosuvastatin (CRESTOR) 10 MG tablet Take 10 mg by mouth at bedtime.    . traZODone (DESYREL) 50 MG tablet Take by mouth.    . valACYclovir (VALTREX) 500 MG tablet Take 500 mg by mouth 2 (two) times daily.    . nortriptyline (PAMELOR) 10 MG capsule 3 CAPSULES BY MOUTH AT NIGHT 90 capsule 11   No facility-administered medications prior to visit.    PAST MEDICAL HISTORY: Past Medical History:  Diagnosis Date  . Asthma   . Chronic insomnia 09/23/2018  . Common migraine with intractable migraine 09/23/2018  . Depression   . High cholesterol   . HSV-2 infection   . Hypothyroidism     PAST SURGICAL HISTORY: Past Surgical History:  Procedure Laterality Date  . DILATION AND CURETTAGE OF UTERUS    . episiotomy repair      FAMILY HISTORY: Family History  Problem Relation Age of Onset  . Skin cancer Mother   . Hypertension Mother   . High Cholesterol Mother   . High blood pressure Father   . High Cholesterol Father   . Congestive Heart Failure Maternal Grandmother   . Breast cancer Paternal Grandmother   . Alzheimer's disease Paternal Grandmother     SOCIAL HISTORY: Social History   Socioeconomic History  . Marital status: Married    Spouse name: Not on file  .  Number of children: Not on file  . Years of education: Not on file  . Highest education level: Not on file  Occupational History  . Not on file  Tobacco Use  . Smoking status: Never Smoker  . Smokeless tobacco: Never Used  Substance and Sexual Activity  . Alcohol use: Yes    Comment: 1-2 per drinks per week  . Drug use: Never  . Sexual activity: Not on file  Other Topics Concern  . Not on file  Social History Narrative   1-2 cups of caffeine daily    Lives at  home with spouse and three children    Right handed    Social Determinants of Health   Financial Resource Strain:   . Difficulty of Paying Living Expenses: Not on file  Food Insecurity:   . Worried About Programme researcher, broadcasting/film/video in the Last Year: Not on file  . Ran Out of Food in the Last Year: Not on file  Transportation Needs:   . Lack of Transportation (Medical): Not on file  . Lack of Transportation (Non-Medical): Not on file  Physical Activity:   . Days of Exercise per Week: Not on file  . Minutes of Exercise per Session: Not on file  Stress:   . Feeling of Stress : Not on file  Social Connections:   . Frequency of Communication with Friends and Family: Not on file  . Frequency of Social Gatherings with Friends and Family: Not on file  . Attends Religious Services: Not on file  . Active Member of Clubs or Organizations: Not on file  . Attends Banker Meetings: Not on file  . Marital Status: Not on file  Intimate Partner Violence:   . Fear of Current or Ex-Partner: Not on file  . Emotionally Abused: Not on file  . Physically Abused: Not on file  . Sexually Abused: Not on file   PHYSICAL EXAM  Vitals:   08/01/20 1301  BP: 113/80  Pulse: 74  Weight: 203 lb (92.1 kg)  Height: 5\' 7"  (1.702 m)   Body mass index is 31.79 kg/m.  Generalized: Well developed, in no acute distress   Neurological examination  Mentation: Alert oriented to time, place, history taking. Follows all commands speech and language fluent Cranial nerve II-XII: Pupils were equal round reactive to light. Extraocular movements were full, visual field were full on confrontational test. Facial sensation and strength were normal. Head turning and shoulder shrug  were normal and symmetric. Motor: The motor testing reveals 5 over 5 strength of all 4 extremities. Good symmetric motor tone is noted throughout.  Sensory: Sensory testing is intact to soft touch on all 4 extremities. No evidence of  extinction is noted.  Coordination: Cerebellar testing reveals good finger-nose-finger and heel-to-shin bilaterally.  Gait and station: Gait is normal. Reflexes: Deep tendon reflexes are symmetric and normal bilaterally.   DIAGNOSTIC DATA (LABS, IMAGING, TESTING) - I reviewed patient records, labs, notes, testing and imaging myself where available.  Lab Results  Component Value Date   WBC 10.1 09/23/2018   HGB 13.1 09/23/2018   HCT 39.5 09/23/2018   MCV 84 09/23/2018   PLT 335 09/23/2018      Component Value Date/Time   NA 139 09/23/2018 1431   K 4.3 09/23/2018 1431   CL 101 09/23/2018 1431   CO2 23 09/23/2018 1431   GLUCOSE 87 09/23/2018 1431   BUN 11 09/23/2018 1431   CREATININE 0.82 09/23/2018 1431   CALCIUM 9.8  09/23/2018 1431   PROT 7.1 09/23/2018 1431   ALBUMIN 4.1 09/23/2018 1431   AST 15 09/23/2018 1431   ALT 6 09/23/2018 1431   ALKPHOS 82 09/23/2018 1431   BILITOT 0.2 09/23/2018 1431   GFRNONAA 92 09/23/2018 1431   GFRAA 106 09/23/2018 1431   No results found for: CHOL, HDL, LDLCALC, LDLDIRECT, TRIG, CHOLHDL No results found for: KDTO6Z No results found for: VITAMINB12 Lab Results  Component Value Date   TSH 2.370 09/23/2018    ASSESSMENT AND PLAN 39 y.o. year old female  has a past medical history of Asthma, Chronic insomnia (09/23/2018), Common migraine with intractable migraine (09/23/2018), Depression, High cholesterol, HSV-2 infection, and Hypothyroidism. here with:  1.  Chronic migraine headache 2.  Abnormal MRI of the brain  Reviewed case with Dr. Anne Hahn, MRI of the brain in November 2021 shows more than expected white matter disease for a 39 year old, question cardio embolic source?  Have ordered CT angiogram of the head and neck, cannot have these until early next year, due to finances.  I will go ahead and order labs for hypercoagulable work-up, along with transcranial Doppler study. Had echocardiogram with cardiology that was unremarkable.  She will  follow-up in 4 months or sooner if needed.  Orders Placed This Encounter  Procedures  . Lupus anticoagulant    Standing Status:   Future    Standing Expiration Date:   08/01/2021  . MTHFR DNA Analysis    Standing Status:   Future    Standing Expiration Date:   08/01/2021  . Methylmalonic acid, serum    Standing Status:   Future    Standing Expiration Date:   08/01/2021  . Factor 5 leiden    Standing Status:   Future    Standing Expiration Date:   08/01/2021  . Protein C activity    Standing Status:   Future    Standing Expiration Date:   08/01/2021  . PROTEIN S PANEL    Standing Status:   Future    Standing Expiration Date:   08/01/2021  . Creatinine, Serum    Standing Status:   Future    Standing Expiration Date:   08/01/2021   Impression ECHO 06/07/2020 Performed by [facility] Normal left ventricular chamber size, wall thickness, and systolic wall  motion.  . Preserved LVEF 60-65%. Manual apical biplane EF 62.9%.  . Normal diastolic function.  . No significant valvular abnormalities are noted.  . Pulmonary artery systolic pressure cannot be determined.  . No prior echocardiogram report is available for comparison.  07/06/2020 IMPRESSION: This MRI of the brain with and without contrast shows the following: 1.  There are multiple T2/flair hyperintense foci in the subcortical and deep white matter of the hemispheres.  This is a nonspecific finding and could be due to chronic microvascular ischemic change or the sequela of cardio-emboli, migraine, trauma, infection/inflammation/vasculitis.  Demyelination is unlikely to have this appearance.  Consider evaluation for PFO or other cardioembolic source. 2.  There are no acute findings and there is a normal enhancement pattern.   I spent 30 minutes of face-to-face and non-face-to-face time with patient.  This included previsit chart review, lab review, study review, order entry, electronic health record documentation, patient  education.  Margie Ege, AGNP-C, DNP 08/01/2020, 1:44 PM [facility] [address], [address] [place], [place] [address] [phone]

## 2020-08-01 NOTE — Progress Notes (Signed)
I have read the note, and I agree with the clinical assessment and plan.  Artis Beggs K Alexio Sroka   

## 2020-08-01 NOTE — Patient Instructions (Signed)
Check kidney function before scan  Schedule CT angio when ready next year  See you back in 4 months

## 2020-08-02 ENCOUNTER — Telehealth: Payer: Self-pay | Admitting: Neurology

## 2020-08-02 NOTE — Telephone Encounter (Signed)
I called second time to let her know about additional blood testing, bubble study ordered, left a message.

## 2020-08-08 ENCOUNTER — Telehealth: Payer: Self-pay | Admitting: Neurology

## 2020-08-08 NOTE — Telephone Encounter (Signed)
08/08/2020 Patient can be scheduled For Doppler TCD NPR Spoke to [facility] # [id number] Arma Heading

## 2020-09-19 DIAGNOSIS — G43019 Migraine without aura, intractable, without status migrainosus: Secondary | ICD-10-CM | POA: Diagnosis not present

## 2020-09-19 DIAGNOSIS — E039 Hypothyroidism, unspecified: Secondary | ICD-10-CM | POA: Diagnosis not present

## 2020-09-19 DIAGNOSIS — J45909 Unspecified asthma, uncomplicated: Secondary | ICD-10-CM | POA: Diagnosis not present

## 2020-09-19 DIAGNOSIS — E782 Mixed hyperlipidemia: Secondary | ICD-10-CM | POA: Diagnosis not present

## 2020-09-27 ENCOUNTER — Ambulatory Visit: Payer: BC Managed Care – PPO | Admitting: Neurology

## 2020-10-06 ENCOUNTER — Ambulatory Visit (HOSPITAL_COMMUNITY)
Admission: RE | Admit: 2020-10-06 | Discharge: 2020-10-06 | Disposition: A | Discharge status: Home / Self Care | Payer: BC Managed Care – PPO | Source: Ambulatory Visit | Attending: Neurology | Admitting: Neurology

## 2020-10-06 ENCOUNTER — Other Ambulatory Visit: Payer: Self-pay

## 2020-10-06 DIAGNOSIS — R9089 Other abnormal findings on diagnostic imaging of central nervous system: Secondary | ICD-10-CM | POA: Insufficient documentation

## 2020-10-06 NOTE — Progress Notes (Signed)
TCD has been completed.   Preliminary results in CV Proc.   Blanch Media 10/06/2020 1:21 PM

## 2020-10-10 DIAGNOSIS — R300 Dysuria: Secondary | ICD-10-CM | POA: Diagnosis not present

## 2020-10-10 DIAGNOSIS — R059 Cough, unspecified: Secondary | ICD-10-CM | POA: Diagnosis not present

## 2020-10-10 DIAGNOSIS — J069 Acute upper respiratory infection, unspecified: Secondary | ICD-10-CM | POA: Diagnosis not present

## 2020-10-12 ENCOUNTER — Telehealth: Payer: Self-pay

## 2020-10-12 NOTE — Telephone Encounter (Signed)
-----   Message from Glean Salvo, NP sent at 10/11/2020  7:58 AM EST ----- Sent my chart message:

## 2020-10-16 ENCOUNTER — Telehealth: Payer: Self-pay | Admitting: Neurology

## 2020-10-16 NOTE — Telephone Encounter (Signed)
Patient called she is ready to Have Here CT scans Now . Thanks Annabelle Harman

## 2020-10-16 NOTE — Telephone Encounter (Signed)
Noted, order sent to [facility]. BC[employer] auth: NPR Ref # [id number] order sent to [facility].

## 2020-10-19 DIAGNOSIS — R1030 Lower abdominal pain, unspecified: Secondary | ICD-10-CM | POA: Diagnosis not present

## 2020-10-23 ENCOUNTER — Telehealth: Payer: Self-pay | Admitting: Neurology

## 2020-10-23 DIAGNOSIS — R300 Dysuria: Secondary | ICD-10-CM | POA: Diagnosis not present

## 2020-10-23 DIAGNOSIS — Z3202 Encounter for pregnancy test, result negative: Secondary | ICD-10-CM | POA: Diagnosis not present

## 2020-10-23 NOTE — Telephone Encounter (Signed)
Pt. states that she has a kidney stone that she has not passed & because of this, does she still need to go to the angio gram appt. Please advise.

## 2020-10-23 NOTE — Telephone Encounter (Signed)
If the stone is causing problems and pain then I'd wait until she passes it or no longer is symptomatic.

## 2020-10-24 NOTE — Telephone Encounter (Signed)
I called pt and relayed SS/NP note.  She stated that she is experiencing a little bit of pain this am. I relayed that she can call and cancel and reschedule at a later date.  She will check to see how good authorization is and see if they do creat levels.  She will let us know if needed by [place].  She appreciated call back.

## 2020-10-25 ENCOUNTER — Other Ambulatory Visit: Payer: BC Managed Care – PPO

## 2020-11-29 DIAGNOSIS — E782 Mixed hyperlipidemia: Secondary | ICD-10-CM | POA: Diagnosis not present

## 2020-11-29 DIAGNOSIS — R350 Frequency of micturition: Secondary | ICD-10-CM | POA: Diagnosis not present

## 2020-11-29 DIAGNOSIS — E039 Hypothyroidism, unspecified: Secondary | ICD-10-CM | POA: Diagnosis not present

## 2020-11-29 DIAGNOSIS — R5383 Other fatigue: Secondary | ICD-10-CM | POA: Diagnosis not present

## 2020-11-29 DIAGNOSIS — F325 Major depressive disorder, single episode, in full remission: Secondary | ICD-10-CM | POA: Diagnosis not present

## 2020-12-04 ENCOUNTER — Ambulatory Visit: Payer: BC Managed Care – PPO | Admitting: Neurology

## 2020-12-05 DIAGNOSIS — R350 Frequency of micturition: Secondary | ICD-10-CM | POA: Diagnosis not present

## 2020-12-05 DIAGNOSIS — N200 Calculus of kidney: Secondary | ICD-10-CM | POA: Diagnosis not present

## 2021-01-01 ENCOUNTER — Ambulatory Visit
Admission: RE | Admit: 2021-01-01 | Discharge: 2021-01-01 | Disposition: A | Discharge status: Home / Self Care | Payer: Self-pay | Source: Ambulatory Visit | Attending: Neurology | Admitting: Neurology

## 2021-01-01 ENCOUNTER — Telehealth: Payer: Self-pay | Admitting: Neurology

## 2021-01-01 ENCOUNTER — Ambulatory Visit
Admission: RE | Admit: 2021-01-01 | Discharge: 2021-01-01 | Disposition: A | Discharge status: Home / Self Care | Payer: BC Managed Care – PPO | Source: Ambulatory Visit | Attending: Neurology | Admitting: Neurology

## 2021-01-01 DIAGNOSIS — J0100 Acute maxillary sinusitis, unspecified: Secondary | ICD-10-CM | POA: Diagnosis not present

## 2021-01-01 DIAGNOSIS — J320 Chronic maxillary sinusitis: Secondary | ICD-10-CM | POA: Diagnosis not present

## 2021-01-01 DIAGNOSIS — R9082 White matter disease, unspecified: Secondary | ICD-10-CM | POA: Diagnosis not present

## 2021-01-01 DIAGNOSIS — R9089 Other abnormal findings on diagnostic imaging of central nervous system: Secondary | ICD-10-CM

## 2021-01-01 DIAGNOSIS — J3489 Other specified disorders of nose and nasal sinuses: Secondary | ICD-10-CM | POA: Diagnosis not present

## 2021-01-01 DIAGNOSIS — J341 Cyst and mucocele of nose and nasal sinus: Secondary | ICD-10-CM | POA: Diagnosis not present

## 2021-01-01 MED ORDER — IOPAMIDOL (ISOVUE-370) INJECTION 76%
75.0000 mL | Freq: Once | INTRAVENOUS | Status: AC | PRN
  Administered 2021-01-01: 75 mL via INTRAVENOUS

## 2021-01-01 NOTE — Telephone Encounter (Signed)
I relayed to her that CT head/neck results (unremarkable).  Did speak to her about labs, she had some with [facility], did not see what we wanted.  She will come in Wednesday at 0900.

## 2021-01-01 NOTE — Telephone Encounter (Signed)
Please call the patient. CTA studies were unremarkable. Still needs blood work, orders have been in since Dec, part of hypercoagulable work-up.  IMPRESSION: CT head:  1. No evidence of acute intracranial abnormality. 2. Nonspecific chronic cerebral white matter disease, better delineated on the brain MRI of 07/06/2020. 3. Bilateral maxillary sinusitis.  CTA neck:  The common carotid, internal carotid and vertebral arteries are patent within the neck without stenosis. No significant atherosclerotic disease.  CTA head:  Unremarkable exam. No intracranial large vessel occlusion or proximal high-grade arterial stenosis.

## 2021-01-03 ENCOUNTER — Other Ambulatory Visit (INDEPENDENT_AMBULATORY_CARE_PROVIDER_SITE_OTHER): Payer: Self-pay

## 2021-01-03 DIAGNOSIS — R9089 Other abnormal findings on diagnostic imaging of central nervous system: Secondary | ICD-10-CM

## 2021-01-03 DIAGNOSIS — Z0289 Encounter for other administrative examinations: Secondary | ICD-10-CM

## 2021-01-11 LAB — LUPUS ANTICOAGULANT
Dilute Viper Venom Time: 30.1 s (ref 0.0–47.0)
PTT Lupus Anticoagulant: 38 s (ref 0.0–51.9)
Thrombin Time: 17.8 s (ref 0.0–23.0)
dPT Confirm Ratio: 0.95 Ratio (ref 0.00–1.34)
dPT: 32.8 s (ref 0.0–47.6)

## 2021-01-11 LAB — MTHFR DNA ANALYSIS

## 2021-01-11 LAB — METHYLMALONIC ACID, SERUM: Methylmalonic Acid: 151 nmol/L (ref 0–378)

## 2021-01-11 LAB — PROTEIN S PANEL
Protein S Activity: 89 % (ref 63–140)
Protein S Ag, Free: 104 % (ref 61–136)
Protein S Ag, Total: 100 % (ref 60–150)

## 2021-01-11 LAB — CREATININE, SERUM
Creatinine, Ser: 0.85 mg/dL (ref 0.57–1.00)
eGFR: 90 mL/min/1.73 (ref >59)

## 2021-01-11 LAB — FACTOR 5 LEIDEN

## 2021-01-11 LAB — PROTEIN C ACTIVITY: Protein C Activity: 140 % (ref 73–180)

## 2021-01-16 ENCOUNTER — Telehealth: Payer: Self-pay | Admitting: Neurology

## 2021-01-16 DIAGNOSIS — Z01419 Encounter for gynecological examination (general) (routine) without abnormal findings: Secondary | ICD-10-CM | POA: Diagnosis not present

## 2021-01-16 DIAGNOSIS — N898 Other specified noninflammatory disorders of vagina: Secondary | ICD-10-CM | POA: Diagnosis not present

## 2021-01-16 NOTE — Telephone Encounter (Signed)
Dr. Epimenio Foot did not want to use the 01/25/21 at 9am slot. He said any MD can see this pt. If he has opening, can use new pt slot

## 2021-01-16 NOTE — Telephone Encounter (Signed)
Please call the patient, hypercoagulable lab lwork-up is unremarkable. Reviewed plan with Dr. Anne Hahn, consider LP in future or MRI cervical spine, MRI brain needs to be followed annually, MS in differential.  She was supposed to have a 35-month follow-up with Dr. Anne Hahn, please set her up with Dr. Anne Hahn, Dr. Lucia Gaskins or Dr. Epimenio Foot for 6 month follow-up (Dr. Clarisa Kindred recommendations). She is seen for migraines, but more white matter disease than expected in 40 year old.

## 2021-01-17 NOTE — Telephone Encounter (Signed)
I called patient and gave her the results per Sarah appointment at 6 months with Dr. Epimenio Foot 1:00 07-12-21. Pt doing ok.  The lab results were unremarkable.  I relayed that will check MRI annually, LP possible if needed.  MS differential.  She appreciated call back.  Will call back sooner if needed.

## 2021-01-31 DIAGNOSIS — N200 Calculus of kidney: Secondary | ICD-10-CM | POA: Diagnosis not present

## 2021-01-31 DIAGNOSIS — R109 Unspecified abdominal pain: Secondary | ICD-10-CM | POA: Diagnosis not present

## 2021-04-02 DIAGNOSIS — R310 Gross hematuria: Secondary | ICD-10-CM | POA: Diagnosis not present

## 2021-04-02 DIAGNOSIS — N200 Calculus of kidney: Secondary | ICD-10-CM | POA: Diagnosis not present

## 2021-07-12 ENCOUNTER — Encounter: Payer: Self-pay | Admitting: Neurology

## 2021-07-12 ENCOUNTER — Ambulatory Visit: Payer: BC Managed Care – PPO | Admitting: Neurology

## 2021-07-12 VITALS — BP 135/90 | HR 76 | Ht 67.0 in | Wt 209.0 lb

## 2021-07-12 DIAGNOSIS — G43019 Migraine without aura, intractable, without status migrainosus: Secondary | ICD-10-CM | POA: Diagnosis not present

## 2021-07-12 DIAGNOSIS — R9089 Other abnormal findings on diagnostic imaging of central nervous system: Secondary | ICD-10-CM | POA: Diagnosis not present

## 2021-07-12 NOTE — Progress Notes (Signed)
[facility]  PATIENT: Crystal Heath DOB: 10/08/1980  REFERRING DOCTOR OR PCP: Bolivar Haw, PA-C SOURCE: Patient, notes from Dr. Jannifer Franklin and Butler Denmark, NP, laboratory, imaging and procedure reports, MRI and CT imaging personally reviewed.  _________________________________   HISTORICAL  CHIEF COMPLAINT:  Chief Complaint  Patient presents with   Follow-up    Rm 1, alone. TOC from Dr. Jannifer Franklin. Pt reports doing well.     HISTORY OF PRESENT ILLNESS:  She is a 40 y.o.  woman with migraines and an abnormal MRI of the brain.Marland Kitchen  UPDTATE 07/12/2021: She has been seen in this office for migraines and an abnormal brain MRI.  Upon Dr. Tobey Grim retirement, she is transferring her care to me.  She has had migraine HA 1-2 times a week .  They last a couple hours each.   The quality is pressure.  Most headaches are symmetric.  Often they improve by themself but sometimes takes Ibuprofen or Tylenol with benefit.     She denies aura.    No N/V.  She has mild photophobia.  Moving intensifies pain (especially bending over).     She denies any neurologic symptom such as numbness, visual changes.     She is trying to stay active.    Vascular risks: She does not smoke.  She does not have hypertension or diabetes.  She is on Crestor for elevated lipids.  No known cardiac disease.   To her knowledge, her delivery was normal.    She reached all milestones appropriatey    Imaging: MRI brain 07/08/2020 showed  multiple T2/flair hyperintense foci in the subcortical and deep white matter of the hemispheres.  This is a nonspecific finding and could be due to chronic microvascular ischemic change or the sequela of cardio-emboli, migraine, trauma, infection/inflammation/vasculitis.  Demyelination is unlikely to have this appearance.  Consider evaluation for PFO or other cardioembolic source.  There is a cavum septum pellucidum.     There are no acute findings and there is a normal  enhancement pattern.  Compared to an MRI from 2019, there were no definite changes.  CT angiogram of the neck and head 01/01/2021 was normal.  Transcranial Dopplers 10/06/2020 was normal.  Bubble study was not done.  Cardiac evaluation: Echo ([facility]) 06/07/2020 showed LVEF 60-65%.  Manual apical biplane EF 62.9%.   Normal diastolic function.   No significant valvular abnormalities are noted.   Pulmonary artery systolic pressure cannot be determined.  It does not appear as though bubble study was done.  48-hour Holter 06/19/2020 was negative.  Cardiac CT calcium score was 0.  Major arteries were normal.  Stress test 06/07/2020 was stopped due to chest pain though she did reach 102% maximum age-predicted heart rate  Laboratory: 01/03/2021: Lupus anticoagulant, factor V Leyden, protein C, protein S were negative or unremarkable 09/23/2018: ANCA, angiotensin-converting enzyme, Lyme antibodies, ANA, ESR, HIV, RPR were negative or unremarkable.  REVIEW OF SYSTEMS: Constitutional: No fevers, chills, sweats, or change in appetite Eyes: No visual changes, double vision, eye pain Ear, nose and throat: No hearing loss, ear pain, nasal congestion, sore throat Cardiovascular: No chest pain, palpitations Respiratory:  No shortness of breath at rest or with exertion.   No wheezes GastrointestinaI: No nausea, vomiting, diarrhea, abdominal pain, fecal incontinence Genitourinary:  No dysuria, urinary retention or frequency.  No nocturia. Musculoskeletal:  No neck pain, back pain Integumentary: No rash, pruritus, skin lesions Neurological: as above Psychiatric: No depression at this time.  No anxiety  Endocrine: No palpitations, diaphoresis, change in appetite, change in weigh or increased thirst Hematologic/Lymphatic:  No anemia, purpura, petechiae. Allergic/Immunologic: No itchy/runny eyes, nasal congestion, recent allergic reactions, rashes  ALLERGIES: No Known Allergies  HOME MEDICATIONS:  Current  Outpatient Medications:    levothyroxine (SYNTHROID) 75 MCG tablet, Take 75 mcg by mouth daily., Disp: , Rfl:    rosuvastatin (CRESTOR) 10 MG tablet, Take 10 mg by mouth at bedtime., Disp: , Rfl:    traZODone (DESYREL) 50 MG tablet, Take by mouth., Disp: , Rfl:    valACYclovir (VALTREX) 500 MG tablet, Take 500 mg by mouth 2 (two) times daily., Disp: , Rfl:   PAST MEDICAL HISTORY: Past Medical History:  Diagnosis Date   Asthma    Chronic insomnia 09/23/2018   Common migraine with intractable migraine 09/23/2018   Depression    High cholesterol    HSV-2 infection    Hypothyroidism     PAST SURGICAL HISTORY: Past Surgical History:  Procedure Laterality Date   DILATION AND CURETTAGE OF UTERUS     episiotomy repair      FAMILY HISTORY: Family History  Problem Relation Age of Onset   Skin cancer Mother    Hypertension Mother    High Cholesterol Mother    High blood pressure Father    High Cholesterol Father    Congestive Heart Failure Maternal Grandmother    Breast cancer Paternal Grandmother    Alzheimer's disease Paternal Grandmother     SOCIAL HISTORY:  Social History   Socioeconomic History   Marital status: Married    Spouse name: Not on file   Number of children: Not on file   Years of education: Not on file   Highest education level: Not on file  Occupational History   Not on file  Tobacco Use   Smoking status: Never   Smokeless tobacco: Never  Substance and Sexual Activity   Alcohol use: Yes    Comment: 1-2 per drinks per week   Drug use: Never   Sexual activity: Not on file  Other Topics Concern   Not on file  Social History Narrative   1-2 cups of caffeine daily    Lives at home with spouse and three children    Right handed    Social Determinants of Health   Financial Resource Strain: Not on file  Food Insecurity: Not on file  Transportation Needs: Not on file  Physical Activity: Not on file  Stress: Not on file  Social Connections: Not on  file  Intimate Partner Violence: Not on file     PHYSICAL EXAM  Vitals:   07/12/21 1254  BP: 135/90  Pulse: 76  Weight: 209 lb (94.8 kg)  Height: '5\' 7"'  (1.702 m)    Body mass index is 32.73 kg/m.   General: The patient is well-developed and well-nourished and in no acute distress  HEENT:  Head is Mather/AT.  Sclera are anicteric.  Funduscopic exam shows normal optic discs and retinal vessels.  Neck: No carotid bruits are noted.  The neck is nontender.  Cardiovascular: The heart has a regular rate and rhythm with a normal S1 and S2. There were no murmurs, gallops or rubs.    Skin: Extremities are without rash or  edema.  Musculoskeletal:  Back is nontender  Neurologic Exam  Mental status: The patient is alert and oriented x 3 at the time of the examination. The patient has apparent normal recent and remote memory, with an apparently normal attention span and  concentration ability.   Speech is normal.  Cranial nerves: Extraocular movements are full. Pupils are equal, round, and reactive to light and accomodation.  Visual fields are full.  Facial symmetry is present. There is good facial sensation to soft touch bilaterally.Facial strength is normal.  Trapezius and sternocleidomastoid strength is normal. No dysarthria is noted.  The tongue is midline, and the patient has symmetric elevation of the soft palate. No obvious hearing deficits are noted.  Motor:  Muscle bulk is normal.   Tone is normal. Strength is  5 / 5 in all 4 extremities.   Sensory: Sensory testing is intact to pinprick, soft touch and vibration sensation in all 4 extremities.  Coordination: Cerebellar testing reveals good finger-nose-finger and heel-to-shin bilaterally.  Gait and station: Station is normal.   Gait is normal. Tandem gait is normal. Romberg is negative.   Reflexes: Deep tendon reflexes are symmetric and normal bilaterally.   Plantar responses are flexor.    DIAGNOSTIC DATA (LABS, IMAGING,  TESTING) - I reviewed patient records, labs, notes, testing and imaging myself where available.  Lab Results  Component Value Date   WBC 10.1 09/23/2018   HGB 13.1 09/23/2018   HCT 39.5 09/23/2018   MCV 84 09/23/2018   PLT 335 09/23/2018      Component Value Date/Time   NA 139 09/23/2018 1431   K 4.3 09/23/2018 1431   CL 101 09/23/2018 1431   CO2 23 09/23/2018 1431   GLUCOSE 87 09/23/2018 1431   BUN 11 09/23/2018 1431   CREATININE 0.85 01/03/2021 0857   CALCIUM 9.8 09/23/2018 1431   PROT 7.1 09/23/2018 1431   ALBUMIN 4.1 09/23/2018 1431   AST 15 09/23/2018 1431   ALT 6 09/23/2018 1431   ALKPHOS 82 09/23/2018 1431   BILITOT 0.2 09/23/2018 1431   GFRNONAA 92 09/23/2018 1431   GFRAA 106 09/23/2018 1431   No results found for: CHOL, HDL, LDLCALC, LDLDIRECT, TRIG, CHOLHDL No results found for: HGBA1C No results found for: VITAMINB12 Lab Results  Component Value Date   TSH 2.370 09/23/2018       ASSESSMENT AND PLAN  Abnormal brain MRI - Plan: MR BRAIN W WO CONTRAST  Common migraine with intractable migraine - Plan: MR BRAIN W WO CONTRAST   Ms. Weberg is a 40 year old woman with an abnormal brain MRI showing multiple T2/FLAIR hyperintense foci, predominantly in the subcortical white matter.  The pattern is most typical for either chronic microvascular ischemic change or cardio emboli.  Demyelination is less likely.  We will check an MRI of the brain to determine if there has been any progression. If progression has occurred we would need to check an echocardiogram with bubble contrast or a bubble contrasted transcranial Doppler study to determine if there is a shunt such as a PFO that could explain her abnormal brain MRI. Migraines are mild and she will continue to treat with NSAIDs or Tylenol when they are occur.  If the frequency becomes higher we would need to consider a prophylactic agent. She will return to see me in a year or sooner if there are new or worsening  neurologic symptoms.  42-minute office visit with the majority of the time spent face-to-face for history and physical, discussion/counseling and decision-making.  Additional time with record review and documentation.   Jerianne Anselmo A. Felecia Shelling, MD, [facility] 16/05/9603, 5:40 PM Certified in Neurology, Clinical Neurophysiology, Sleep Medicine and Neuroimaging  [facility] [address] [place], [place] [address] [phone]

## 2021-07-25 ENCOUNTER — Telehealth: Payer: Self-pay | Admitting: Neurology

## 2021-07-25 NOTE — Telephone Encounter (Signed)
LVM for pt to call back to schedule  [employer] auth: NPR spoke to [facility] ref # [id number]

## 2021-07-26 NOTE — Telephone Encounter (Signed)
Patient returned my call she is scheduled at [facility] for 08/01/21.

## 2021-08-01 ENCOUNTER — Ambulatory Visit: Payer: BC Managed Care – PPO

## 2021-08-01 DIAGNOSIS — R9089 Other abnormal findings on diagnostic imaging of central nervous system: Secondary | ICD-10-CM | POA: Diagnosis not present

## 2021-08-01 DIAGNOSIS — G43019 Migraine without aura, intractable, without status migrainosus: Secondary | ICD-10-CM

## 2021-08-01 MED ORDER — GADOBENATE DIMEGLUMINE 529 MG/ML IV SOLN
20.0000 mL | Freq: Once | INTRAVENOUS | Status: AC | PRN
  Administered 2021-08-01: 20 mL via INTRAVENOUS

## 2021-09-24 DIAGNOSIS — R42 Dizziness and giddiness: Secondary | ICD-10-CM | POA: Diagnosis not present

## 2021-09-24 DIAGNOSIS — J45909 Unspecified asthma, uncomplicated: Secondary | ICD-10-CM | POA: Diagnosis not present

## 2021-09-24 DIAGNOSIS — H6501 Acute serous otitis media, right ear: Secondary | ICD-10-CM | POA: Diagnosis not present

## 2021-09-27 DIAGNOSIS — K5909 Other constipation: Secondary | ICD-10-CM | POA: Diagnosis not present

## 2021-09-27 DIAGNOSIS — R195 Other fecal abnormalities: Secondary | ICD-10-CM | POA: Diagnosis not present

## 2021-09-27 DIAGNOSIS — K5900 Constipation, unspecified: Secondary | ICD-10-CM | POA: Diagnosis not present

## 2021-11-14 ENCOUNTER — Encounter: Payer: Self-pay | Admitting: Neurology

## 2021-11-14 ENCOUNTER — Telehealth: Payer: BC Managed Care – PPO | Admitting: Neurology

## 2021-11-14 ENCOUNTER — Telehealth: Payer: Self-pay | Admitting: Neurology

## 2021-11-14 DIAGNOSIS — H539 Unspecified visual disturbance: Secondary | ICD-10-CM | POA: Diagnosis not present

## 2021-11-14 DIAGNOSIS — R9082 White matter disease, unspecified: Secondary | ICD-10-CM | POA: Diagnosis not present

## 2021-11-14 DIAGNOSIS — R519 Headache, unspecified: Secondary | ICD-10-CM

## 2021-11-14 NOTE — Progress Notes (Signed)
? ?[facility] ? ?PATIENT: Crystal Heath ?DOB: 07-Nov-1980 ? ?REFERRING DOCTOR OR PCP: Bolivar Haw, PA-C ?SOURCE: Patient, notes from Dr. Jannifer Franklin and Butler Denmark, NP, laboratory, imaging and procedure reports, MRI and CT imaging personally reviewed. ? ?_________________________________ ? ? ?HISTORICAL ? ?CHIEF COMPLAINT:  ?No chief complaint on file. ? ? ?HISTORY OF PRESENT ILLNESS:  ?She is a 41 y.o.  woman with migraines and an abnormal MRI of the brain.. ? ?Virtual Visit via Video Note ?I connected with Crystal Heath  on 11/14/21 at  2:00 PM EDT by a video enabled telemedicine application and verified that I am speaking with the correct person.  I discussed the limitations of evaluation and management by telemedicine and the availability of in person appointments. The patient expressed understanding and agreed to proceed. ? ?Provider was in the office and patient was at her home. ? ? ?UPDATE 11/14/2021: ?Two weeks ago, she had the onset of diplopia (actual a prime image with several shadow images around it.   She has noted it most with TV and her phone.  She has not had eye pain but has a pressure sensation.    She notes it will occur with right eye monocular vision more than with just the left eye.   She hears her heartbeat in the right ear.    ? ?We discussed her recent brain MRI.  As before, there are multiple T2/FLAIR hyperintense foci predominantly in the subcortical white matter of the hemispheres.  This is nonspecific and would be more consistent with either chronic microvascular ischemic changes or cardio emboli rather than demyelination.   ? ?She has not had many migraines over the last few months since making sure to stay hydrated.  When one occurs, the quality is pressure.  Most headaches are symmetric.  Often they improve by themself but sometimes takes Ibuprofen or Tylenol with benefit.     She denies aura.    No N/V.  She has mild photophobia.  Moving intensifies pain  (especially bending over).    ? ?She denies any neurologic symptom such as numbness, weakness or clumsiness. ? ?Vascular risks: She does not smoke.  She does not have hypertension or diabetes.  She is on Crestor for elevated lipids.  No known cardiac disease.   To her knowledge, her delivery was normal.    She reached all milestones appropriatey ? ?  ?Imaging: ?MRI brain 08/01/2021 showed  Stable pattern of T2/FLAIR hyperintense foci predominantly in the subcortical white matter, unchanged compared to the 07/06/2020 MRI.  Foci did not enhance or appear to be acute..  This is a nonspecific finding and could represent chronic microvascular ischemic change, sequela of migraine or cardio emboli.  Demyelination would be less likely to have this appearance. ? ?MRI brain 07/08/2020 showed  multiple T2/flair hyperintense foci in the subcortical and deep white matter of the hemispheres.  This is a nonspecific finding and could be due to chronic microvascular ischemic change or the sequela of cardio-emboli, migraine, trauma, infection/inflammation/vasculitis.  Demyelination is unlikely to have this appearance.  Consider evaluation for PFO or other cardioembolic source.  There is a cavum septum pellucidum.     There are no acute findings and there is a normal enhancement pattern.  Compared to an MRI from 2019, there were no definite changes. ? ?CT angiogram of the neck and head 01/01/2021 was normal. ? ?Transcranial Dopplers 10/06/2020 was normal.  Bubble study was not done. ? ?Cardiac evaluation: ?Echo ([facility]) 06/07/2020 showed LVEF  60-65%.  Manual apical biplane EF 62.9%.   Normal diastolic function.   No significant valvular abnormalities are noted.   Pulmonary artery systolic pressure cannot be determined.  It does not appear as though bubble study was done. ? ?48-hour Holter 06/19/2020 was negative. ? ?Cardiac CT calcium score was 0.  Major arteries were normal. ? ?Stress test 06/07/2020 was stopped due to chest pain though  she did reach 102% maximum age-predicted heart rate ? ?Laboratory: ?01/03/2021: Lupus anticoagulant, factor V Leyden, protein C, protein S were negative or unremarkable ?09/23/2018: ANCA, angiotensin-converting enzyme, Lyme antibodies, ANA, ESR, HIV, RPR were negative or unremarkable. ? ?REVIEW OF SYSTEMS: ?Constitutional: No fevers, chills, sweats, or change in appetite ?Eyes: No visual changes, double vision, eye pain ?Ear, nose and throat: No hearing loss, ear pain, nasal congestion, sore throat ?Cardiovascular: No chest pain, palpitations ?Respiratory:  No shortness of breath at rest or with exertion.   No wheezes ?GastrointestinaI: No nausea, vomiting, diarrhea, abdominal pain, fecal incontinence ?Genitourinary:  No dysuria, urinary retention or frequency.  No nocturia. ?Musculoskeletal:  No neck pain, back pain ?Integumentary: No rash, pruritus, skin lesions ?Neurological: as above ?Psychiatric: No depression at this time.  No anxiety ?Endocrine: No palpitations, diaphoresis, change in appetite, change in weigh or increased thirst ?Hematologic/Lymphatic:  No anemia, purpura, petechiae. ?Allergic/Immunologic: No itchy/runny eyes, nasal congestion, recent allergic reactions, rashes ? ?ALLERGIES: ?No Known Allergies ? ?HOME MEDICATIONS: ? ?Current Outpatient Medications:  ?  levothyroxine (SYNTHROID) 75 MCG tablet, Take 75 mcg by mouth daily., Disp: , Rfl:  ?  rosuvastatin (CRESTOR) 10 MG tablet, Take 10 mg by mouth at bedtime., Disp: , Rfl:  ?  traZODone (DESYREL) 50 MG tablet, Take by mouth., Disp: , Rfl:  ?  valACYclovir (VALTREX) 500 MG tablet, Take 500 mg by mouth 2 (two) times daily., Disp: , Rfl:  ? ?PAST MEDICAL HISTORY: ?Past Medical History:  ?Diagnosis Date  ? Asthma   ? Chronic insomnia 09/23/2018  ? Common migraine with intractable migraine 09/23/2018  ? Depression   ? High cholesterol   ? HSV-2 infection   ? Hypothyroidism   ? ? ?PAST SURGICAL HISTORY: ?Past Surgical History:  ?Procedure Laterality Date   ? DILATION AND CURETTAGE OF UTERUS    ? episiotomy repair    ? ? ?FAMILY HISTORY: ?Family History  ?Problem Relation Age of Onset  ? Skin cancer Mother   ? Hypertension Mother   ? High Cholesterol Mother   ? High blood pressure Father   ? High Cholesterol Father   ? Congestive Heart Failure Maternal Grandmother   ? Breast cancer Paternal Grandmother   ? Alzheimer's disease Paternal Grandmother   ? ? ? ? ? ?ASSESSMENT AND PLAN ? ?Nonintractable headache, unspecified chronicity pattern, unspecified headache type - Plan: MR MRV HEAD WO CM, ECHOCARDIOGRAM LIMITED BUBBLE STUDY ? ?White matter abnormality on MRI of brain - Plan: ECHOCARDIOGRAM LIMITED BUBBLE STUDY ? ?Vision changes - Plan: ECHOCARDIOGRAM LIMITED BUBBLE STUDY ? ? ?Due to new symptoms and abnormal MRI of the brain (potentially worrisome for PFO/ASD), we will check an echocardiogram with bubble contrast.  If abnormal I will refer to cardiology.   ?She continues to experience pulsatile tinnitus, more on the right.  We will check a MR venogram to make sure there is no evidence of venous sinus thrombosis.  ?Migraines are mild and intermittent.  She will continue to treat with NSAIDs or Tylenol when they are occur.  If the frequency becomes higher we would need  to consider a prophylactic agent. ?She will return to see me in a year or sooner based on results or if there are new or worsening neurologic symptoms. ? ?Follow Up Instructions: ?I discussed the assessment and treatment plan with the patient. The patient was provided an opportunity to ask questions and all were answered. The patient agreed with the plan and demonstrated an understanding of the instructions.    The patient was advised to call back or seek an in-person evaluation if the symptoms worsen or if the condition fails to improve as anticipated. ? ?I provided 25 minutes of non-face-to-face time during this encounter. ? ?Alda Gaultney A. Felecia Shelling, MD, [facility] 8/97/8478, 4:12 PM ?Certified in Neurology,  Clinical Neurophysiology, Sleep Medicine and Neuroimaging ? ?[facility] ?[address], [address] ?[place], [place] [address] ?([phone] ? ?

## 2021-11-14 NOTE — Telephone Encounter (Signed)
Pt asking if could get an MRA or MRV since MRI came back normal. Symptoms are the same vision changes especially at night, hearing heartbeat in right ear. Would like a call back. ?

## 2021-11-14 NOTE — Telephone Encounter (Signed)
Called pt. Sx have not improved, feel they are more consistent. Offered to get her scheduled for office visit today at 2pm w/ Dr. Felecia Shelling. She is unable. Has young child at home and has to pick up older kids from school. She does not leave house until 310p to pick up kids typically. Offered mychart VV. She accepted. I scheduled and explained how she would log in and get ready. She will update med list as well.  ?

## 2021-11-15 ENCOUNTER — Telehealth: Payer: Self-pay | Admitting: Neurology

## 2021-11-15 NOTE — Telephone Encounter (Signed)
[employer] no auth req spoke to Clyde Canterbury ref # [id number], sent message to Lattie Haw she will reach out to the patient to schedule. ?

## 2021-11-20 DIAGNOSIS — R42 Dizziness and giddiness: Secondary | ICD-10-CM | POA: Diagnosis not present

## 2021-11-20 DIAGNOSIS — H9203 Otalgia, bilateral: Secondary | ICD-10-CM | POA: Diagnosis not present

## 2021-11-21 ENCOUNTER — Ambulatory Visit (HOSPITAL_COMMUNITY)
Admission: RE | Admit: 2021-11-21 | Discharge: 2021-11-21 | Disposition: A | Discharge status: Home / Self Care | Payer: BC Managed Care – PPO | Source: Ambulatory Visit | Attending: Neurology | Admitting: Neurology

## 2021-11-21 DIAGNOSIS — H539 Unspecified visual disturbance: Secondary | ICD-10-CM | POA: Diagnosis not present

## 2021-11-21 DIAGNOSIS — R9082 White matter disease, unspecified: Secondary | ICD-10-CM | POA: Diagnosis not present

## 2021-11-21 DIAGNOSIS — R519 Headache, unspecified: Secondary | ICD-10-CM | POA: Diagnosis not present

## 2021-11-21 LAB — ECHOCARDIOGRAM LIMITED BUBBLE STUDY: Area-P 1/2: 3.93 cm2

## 2021-11-26 ENCOUNTER — Telehealth: Payer: Self-pay | Admitting: Neurology

## 2021-11-26 NOTE — Telephone Encounter (Signed)
Pt scheduled for MRV head wo contrast on 12/05/21 at 10:00 am at [facility]. ?

## 2021-12-03 ENCOUNTER — Telehealth: Payer: Self-pay | Admitting: Neurology

## 2021-12-03 DIAGNOSIS — I2789 Other specified pulmonary heart diseases: Secondary | ICD-10-CM

## 2021-12-03 NOTE — Telephone Encounter (Signed)
I spoke to Crystal Heath about the bubble contrasted echocardiogram.  It showed bubbles crossing f into the left side of the heart greater than 6 beats after they showed up on the right.  This is most concerning for an intrapulmonary shunt such as an AV fistula.  I discussed this with her and we will check a CT angiogram of the lungs. ?

## 2021-12-04 ENCOUNTER — Telehealth: Payer: Self-pay | Admitting: Neurology

## 2021-12-04 NOTE — Telephone Encounter (Signed)
[employer] auth: NPR spoke to Crystal Heath ref # [id number] order sent to GI. They will reach out to the patient to schedule.  ?

## 2021-12-05 ENCOUNTER — Ambulatory Visit: Payer: BC Managed Care – PPO

## 2021-12-05 DIAGNOSIS — R519 Headache, unspecified: Secondary | ICD-10-CM | POA: Diagnosis not present

## 2021-12-11 ENCOUNTER — Ambulatory Visit
Admission: RE | Admit: 2021-12-11 | Discharge: 2021-12-11 | Disposition: A | Discharge status: Home / Self Care | Payer: BC Managed Care – PPO | Source: Ambulatory Visit | Attending: Neurology | Admitting: Neurology

## 2021-12-11 DIAGNOSIS — I700 Atherosclerosis of aorta: Secondary | ICD-10-CM | POA: Diagnosis not present

## 2021-12-11 DIAGNOSIS — I898 Other specified noninfective disorders of lymphatic vessels and lymph nodes: Secondary | ICD-10-CM | POA: Diagnosis not present

## 2021-12-11 DIAGNOSIS — I2789 Other specified pulmonary heart diseases: Secondary | ICD-10-CM

## 2021-12-11 DIAGNOSIS — I2510 Atherosclerotic heart disease of native coronary artery without angina pectoris: Secondary | ICD-10-CM | POA: Diagnosis not present

## 2021-12-11 MED ORDER — IOPAMIDOL (ISOVUE-370) INJECTION 76%
75.0000 mL | Freq: Once | INTRAVENOUS | Status: AC | PRN
  Administered 2021-12-11: 75 mL via INTRAVENOUS

## 2021-12-21 DIAGNOSIS — R9082 White matter disease, unspecified: Secondary | ICD-10-CM | POA: Diagnosis not present

## 2021-12-25 DIAGNOSIS — F325 Major depressive disorder, single episode, in full remission: Secondary | ICD-10-CM | POA: Diagnosis not present

## 2021-12-25 DIAGNOSIS — E039 Hypothyroidism, unspecified: Secondary | ICD-10-CM | POA: Diagnosis not present

## 2021-12-25 DIAGNOSIS — E782 Mixed hyperlipidemia: Secondary | ICD-10-CM | POA: Diagnosis not present

## 2021-12-25 DIAGNOSIS — Z0000 Encounter for general adult medical examination without abnormal findings: Secondary | ICD-10-CM | POA: Diagnosis not present

## 2022-07-02 DIAGNOSIS — E782 Mixed hyperlipidemia: Secondary | ICD-10-CM | POA: Diagnosis not present

## 2022-07-02 DIAGNOSIS — E039 Hypothyroidism, unspecified: Secondary | ICD-10-CM | POA: Diagnosis not present

## 2022-07-02 DIAGNOSIS — F5104 Psychophysiologic insomnia: Secondary | ICD-10-CM | POA: Diagnosis not present

## 2022-07-02 DIAGNOSIS — F325 Major depressive disorder, single episode, in full remission: Secondary | ICD-10-CM | POA: Diagnosis not present

## 2022-07-16 ENCOUNTER — Encounter: Payer: Self-pay | Admitting: Neurology

## 2022-07-16 ENCOUNTER — Ambulatory Visit (INDEPENDENT_AMBULATORY_CARE_PROVIDER_SITE_OTHER): Payer: BC Managed Care – PPO | Admitting: Neurology

## 2022-07-16 VITALS — BP 148/96 | HR 77 | Ht 67.0 in | Wt 224.5 lb

## 2022-07-16 DIAGNOSIS — R9082 White matter disease, unspecified: Secondary | ICD-10-CM | POA: Diagnosis not present

## 2022-07-16 DIAGNOSIS — G43019 Migraine without aura, intractable, without status migrainosus: Secondary | ICD-10-CM

## 2022-07-16 DIAGNOSIS — G4482 Headache associated with sexual activity: Secondary | ICD-10-CM

## 2022-07-16 DIAGNOSIS — R519 Headache, unspecified: Secondary | ICD-10-CM

## 2022-07-16 MED ORDER — INDOMETHACIN 25 MG PO CAPS: ORAL_CAPSULE | ORAL | 3 refills | Status: AC

## 2022-07-16 NOTE — Progress Notes (Signed)
[facility]  PATIENT: Crystal Heath DOB: 07-28-1981  REFERRING DOCTOR OR PCP: Bolivar Haw, PA-C SOURCE: Patient, notes from Dr. Jannifer Franklin and Butler Denmark, NP, laboratory, imaging and procedure reports, MRI and CT imaging personally reviewed.  _________________________________   HISTORICAL  CHIEF COMPLAINT:  Chief Complaint  Patient presents with   Follow-up    Pt in room #2 and alone. Pt here today for f/u on her Migraines.    HISTORY OF PRESENT ILLNESS:  She is a 41 y.o.  woman with migraines and an abnormal MRI of the brain.Marland Kitchen  UPDTATE 07/12/2022: Migraines are doing ok.    She has 3-4 mild/dull headaches and two exertional (postcoital HA).   She has pulsatile tinnitus still.   She has gained some weight and is trying to be more active to help shed some pounds.     MRIs of the brain have shown multiple white matter foci and a nonspecific pattern.  Not typical for demyelination.    Due to additional visual symptoms and headache in March 2023,  we checked an echocardiogram of the heart.  It showed that some bubbles crossed over after 6 beats (more worrisome for pulmonary AVM rather than PFO).  CT angiogram of the chest did not show any evidence of AVM or other process.    She has a ophthalmology exam from 12/21/2021 showed normal visual fields and exam.  Migraine quality is often pressure-like.   These occur a few times a month..  Most headaches are symmetric.  Often they improve by themself but sometimes takes Ibuprofen or Tylenol with benefit.     She denies aura.    No N/V.  She has mild photophobia.  Moving intensifies pain (especially bending over).     She denies any neurologic symptom such as numbness, visual changes.     Vascular risks: She does not smoke.  She does not have hypertension or diabetes.  She is on Crestor for elevated lipids.  No known cardiac disease.   To her knowledge, her delivery was normal.    She reached all milestones  appropriatey.       Imaging: MRI of the brain 08/01/2021 was unchanged compared to the 07/08/2020 MRI.  MR Venogram of the 12/05/2021 was normal.  Incidental note of asymmetry of the sigmoid sinus and internal jugular vein, smaller on the left, normal.  MRI brain 07/08/2020 showed  multiple T2/flair hyperintense foci in the subcortical and deep white matter of the hemispheres.  This is a nonspecific finding and could be due to chronic microvascular ischemic change or the sequela of cardio-emboli, migraine, trauma, infection/inflammation/vasculitis.  Demyelination is unlikely to have this appearance.  Consider evaluation for PFO or other cardioembolic source.  There is a cavum septum pellucidum.     There are no acute findings and there is a normal enhancement pattern.  Compared to an MRI from 2019, there were no definite changes.  CT angiogram of the neck and head 01/01/2021 was normal.  Transcranial Dopplers 10/06/2020 was normal.  Bubble study was not done.  Cardiac evaluation: Echo ([facility]) 06/07/2020 showed LVEF 60-65%.  Manual apical biplane EF 62.9%.   Normal diastolic function.   No significant valvular abnormalities are noted.   Pulmonary artery systolic pressure cannot be determined.  It does not appear as though bubble study was done.  Echo with bubble contrast 2023 showed some bubbles crossed over after 6 beat.    CT angiogram 12/11/2021 did not show any evidence of pulmonary AVM  48-hour Holter 06/19/2020 was negative.  Cardiac CT calcium score was 0.  Major arteries were normal.  Stress test 06/07/2020 was stopped due to chest pain though she did reach 102% maximum age-predicted heart rate  Laboratory: 01/03/2021: Lupus anticoagulant, factor V Leyden, protein C, protein S were negative or unremarkable 09/23/2018: ANCA, angiotensin-converting enzyme, Lyme antibodies, ANA, ESR, HIV, RPR were negative or unremarkable.  REVIEW OF SYSTEMS: Constitutional: No fevers, chills, sweats, or  change in appetite Eyes: No visual changes, double vision, eye pain Ear, nose and throat: No hearing loss, ear pain, nasal congestion, sore throat Cardiovascular: No chest pain, palpitations Respiratory:  No shortness of breath at rest or with exertion.   No wheezes GastrointestinaI: No nausea, vomiting, diarrhea, abdominal pain, fecal incontinence Genitourinary:  No dysuria, urinary retention or frequency.  No nocturia. Musculoskeletal:  No neck pain, back pain Integumentary: No rash, pruritus, skin lesions Neurological: as above Psychiatric: No depression at this time.  No anxiety Endocrine: No palpitations, diaphoresis, change in appetite, change in weigh or increased thirst Hematologic/Lymphatic:  No anemia, purpura, petechiae. Allergic/Immunologic: No itchy/runny eyes, nasal congestion, recent allergic reactions, rashes  ALLERGIES: No Known Allergies  HOME MEDICATIONS:  Current Outpatient Medications:    indomethacin (INDOCIN) 25 MG capsule, One po qd prn headache, Disp: 30 capsule, Rfl: 3   levothyroxine (SYNTHROID) 75 MCG tablet, Take 75 mcg by mouth daily., Disp: , Rfl:    rosuvastatin (CRESTOR) 10 MG tablet, Take 10 mg by mouth at bedtime., Disp: , Rfl:    traZODone (DESYREL) 50 MG tablet, Take by mouth., Disp: , Rfl:    valACYclovir (VALTREX) 500 MG tablet, Take 500 mg by mouth 2 (two) times daily., Disp: , Rfl:   PAST MEDICAL HISTORY: Past Medical History:  Diagnosis Date   Asthma    Chronic insomnia 09/23/2018   Common migraine with intractable migraine 09/23/2018   Depression    High cholesterol    HSV-2 infection    Hypothyroidism     PAST SURGICAL HISTORY: Past Surgical History:  Procedure Laterality Date   DILATION AND CURETTAGE OF UTERUS     episiotomy repair      FAMILY HISTORY: Family History  Problem Relation Age of Onset   Skin cancer Mother    Hypertension Mother    High Cholesterol Mother    High blood pressure Father    High Cholesterol  Father    Congestive Heart Failure Maternal Grandmother    Breast cancer Paternal Grandmother    Alzheimer's disease Paternal Grandmother     SOCIAL HISTORY:  Social History   Socioeconomic History   Marital status: Married    Spouse name: Not on file   Number of children: Not on file   Years of education: Not on file   Highest education level: Not on file  Occupational History   Not on file  Tobacco Use   Smoking status: Never   Smokeless tobacco: Never  Substance and Sexual Activity   Alcohol use: Yes    Comment: 1-2 per drinks per week   Drug use: Never   Sexual activity: Not on file  Other Topics Concern   Not on file  Social History Narrative   1-2 cups of caffeine daily    Lives at home with spouse and three children    Right handed    Social Determinants of Health   Financial Resource Strain: Not on file  Food Insecurity: Not on file  Transportation Needs: Not on file  Physical Activity:  Not on file  Stress: Not on file  Social Connections: Not on file  Intimate Partner Violence: Not on file     PHYSICAL EXAM  Vitals:   07/16/22 1241  BP: (!) 148/96  Pulse: 77  Weight: 224 lb 8 oz (101.8 kg)  Height: _0  (1.702 m)    Body mass index is 35.16 kg/m.   General: The patient is well-developed and well-nourished and in no acute distress  HEENT:  Head is [place]/AT.  Sclera are anicteric.    Neurologic Exam  Mental status: The patient is alert and oriented x 3 at the time of the examination. The patient has apparent normal recent and remote memory, with an apparently normal attention span and concentration ability.   Speech is normal.  Cranial nerves: Extraocular movements are full.  The pupils are normal and symmetric.  Facial strength and sensation was normal.. No obvious hearing deficits are noted.  Motor:  Muscle bulk is normal.   Tone is normal. Strength is  5 / 5 in all 4 extremities.   Sensory: Sensory testing is intact to pinprick, soft  touch and vibration sensation in all 4 extremities.  Coordination: Cerebellar testing reveals good finger-nose-finger and heel-to-shin bilaterally.  Gait and station: Station is normal.   Gait is normal. Tandem gait is normal. Romberg is negative.   Reflexes: Deep tendon reflexes are symmetric and normal bilaterally.        ASSESSMENT AND PLAN  Common migraine with intractable migraine  White matter abnormality on MRI of brain  Nonintractable headache, unspecified chronicity pattern, unspecified headache type  Coital headache   Stay active and exercise Migraines are mild and she will continue to treat with NSAIDs or Tylenol when they are occur.  If the frequency becomes higher we would need to consider a prophylactic agent.   If poistcoital or exertional HA occur more frequently, consider indomethacin prophylaxis.    She will return to see me in a year or sooner if there are new or worsening neurologic symptoms.   Cnsider repeat MRi in 2024 to assess progression    Kimberl Vig A. Felecia Shelling, MD, [facility] 56/70/1410, 3:01 PM Certified in Neurology, Clinical Neurophysiology, Sleep Medicine and Neuroimaging  [facility] [address] [place], [place] [address] [phone]

## 2022-07-25 DIAGNOSIS — G4489 Other headache syndrome: Secondary | ICD-10-CM | POA: Diagnosis not present

## 2022-07-25 DIAGNOSIS — J019 Acute sinusitis, unspecified: Secondary | ICD-10-CM | POA: Diagnosis not present

## 2023-05-29 IMAGING — CT CT ANGIO CHEST
1 of 2 series · 18 of 30 positions shown · IV contrast (agent unspecified)
Comparison: None available

CLINICAL DATA: Possible intrapulmonary shunt noted on bubble
contrasted echocardiogram. Assess for intrapulmonary shunt, i.e.
arteriovenous fistula.

EXAM:
CT ANGIOGRAPHY CHEST WITH CONTRAST
TECHNIQUE: Multidetector CT imaging of the chest was performed using the
standard protocol during bolus administration of intravenous
contrast. Multiplanar CT image reconstructions and MIPs were
obtained to evaluate the vascular anatomy.

[Series 7: pe thins · axial · 0.63mm/px · z∈[-310,-54]mm · 18 of 290 slices shown]
[im 17/290  lung]
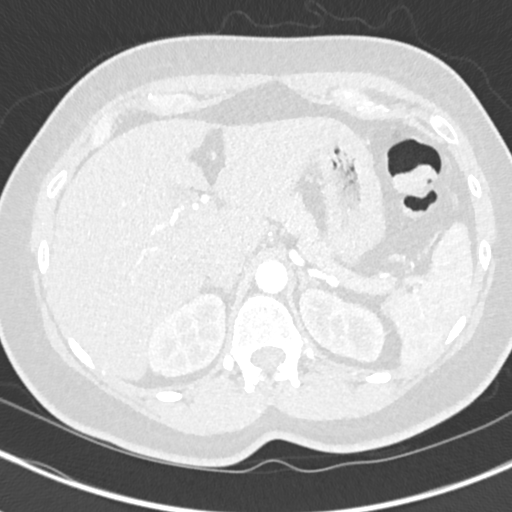
[im 33/290  mediastinal]
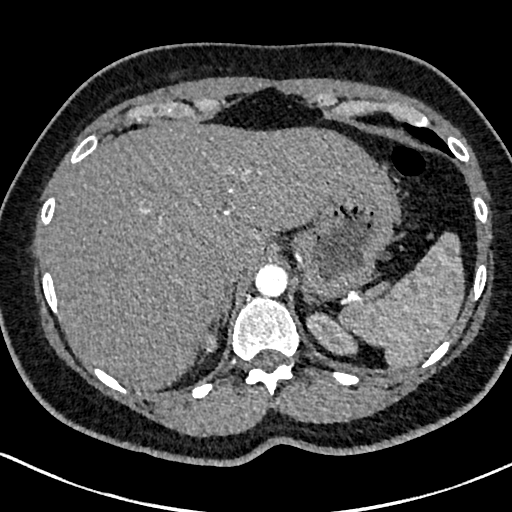
[im 49/290  lung]
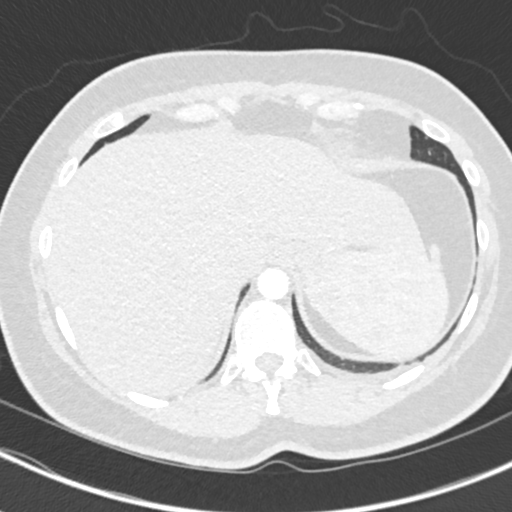
[im 65/290  mediastinal]
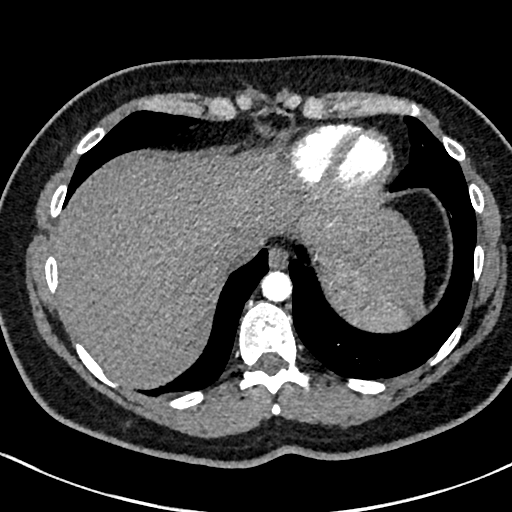
[im 81/290  lung]
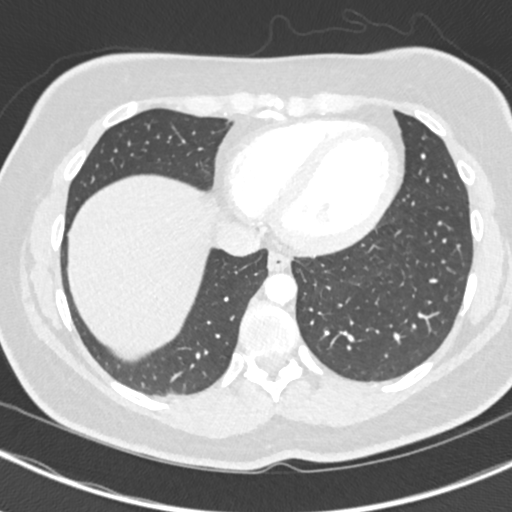
[im 97/290  mediastinal]
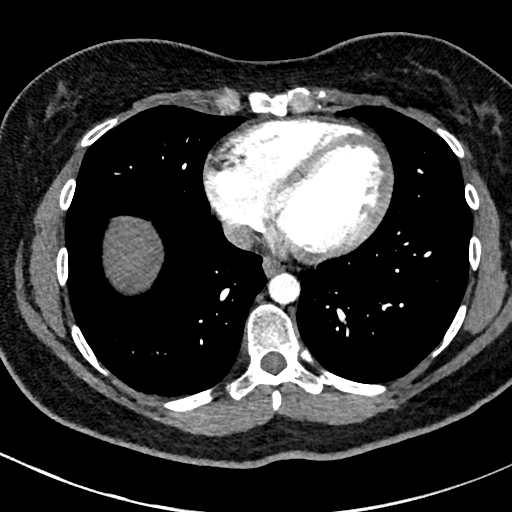
[im 113/290  lung]
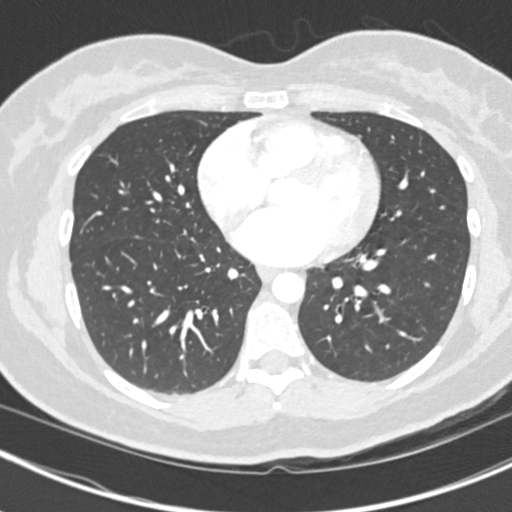
[im 129/290  mediastinal]
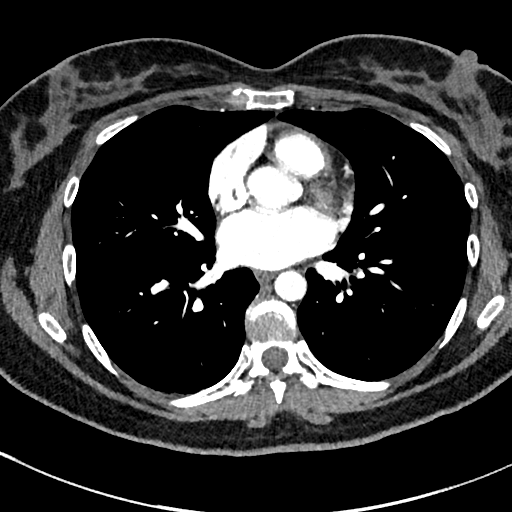
[im 136/290  lung]
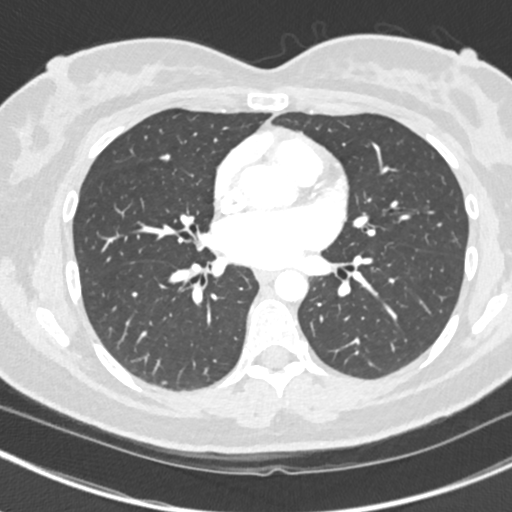
[im 145/290  mediastinal]
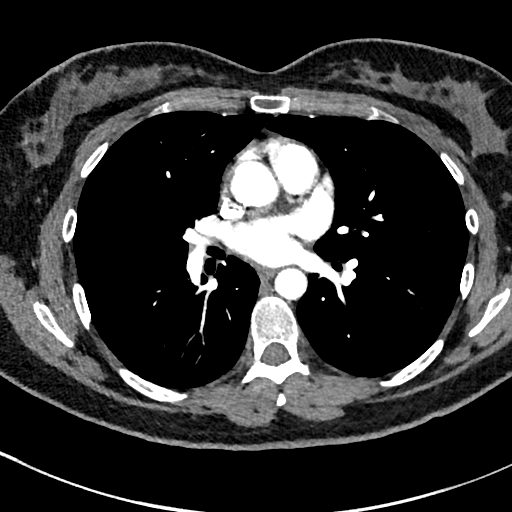
[im 161/290  lung]
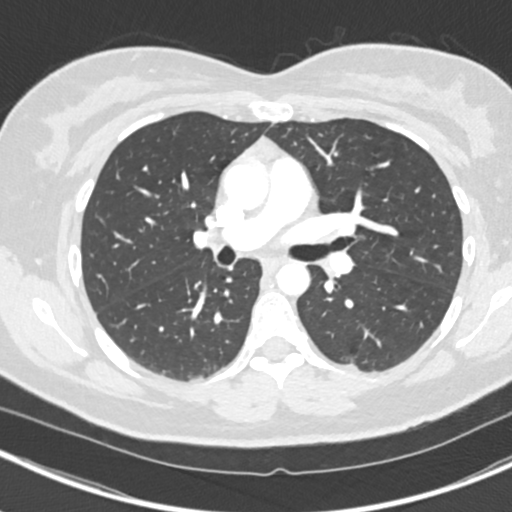
[im 177/290  mediastinal]
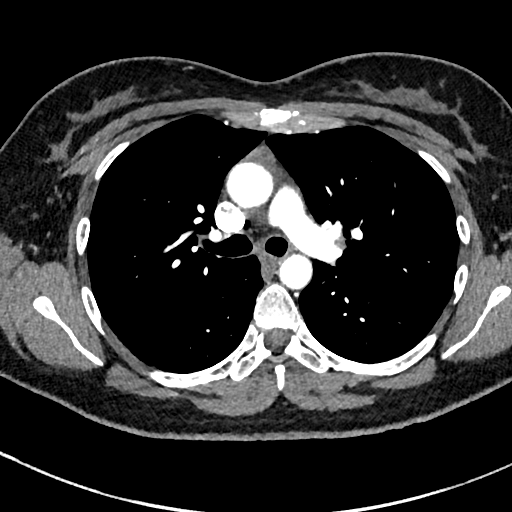
[im 193/290  lung]
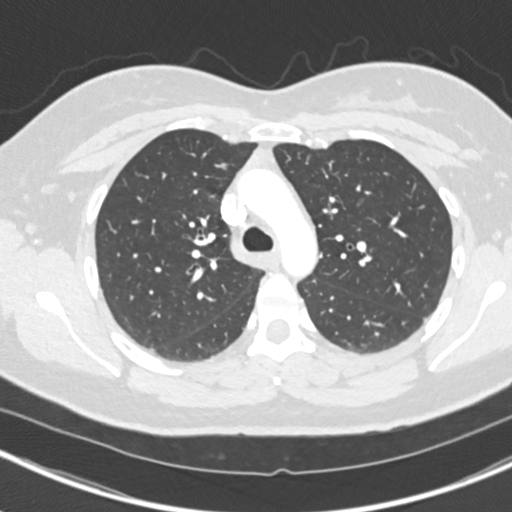
[im 209/290  mediastinal]
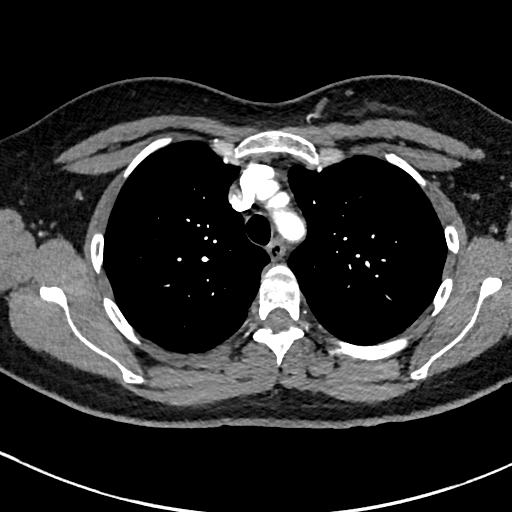
[im 225/290  lung]
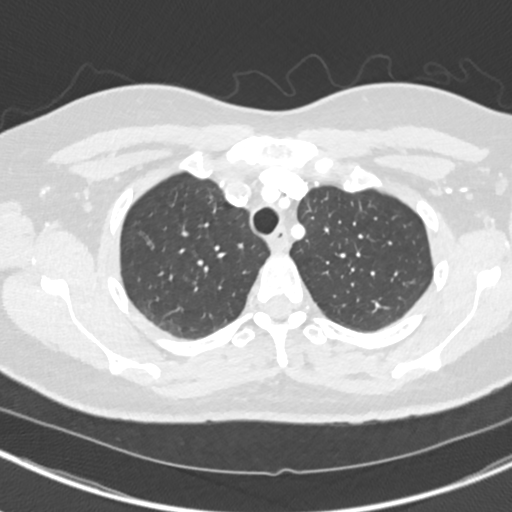
[im 241/290  mediastinal]
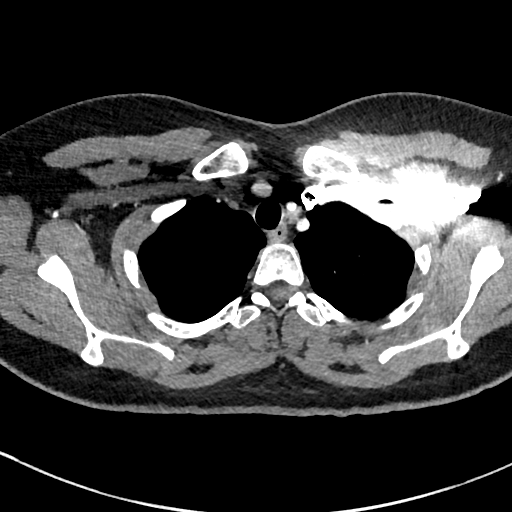
[im 257/290  lung]
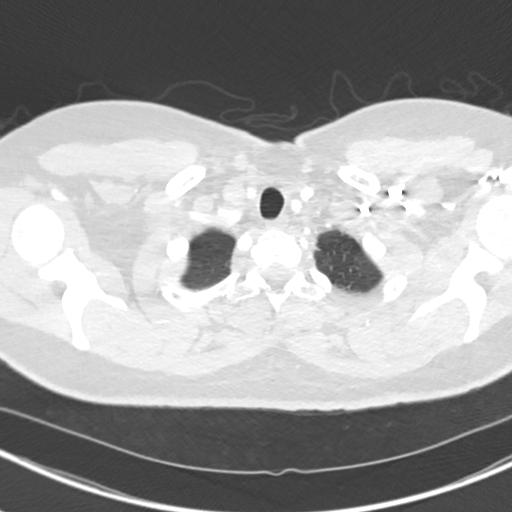
[im 273/290  mediastinal]
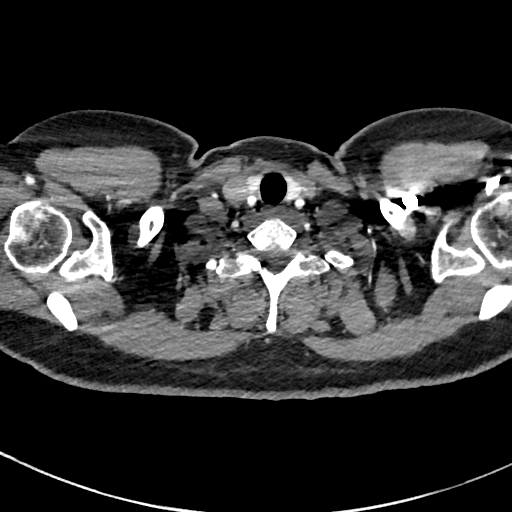

[18 of 30 positions shown; findings below may reference images not displayed]

RADIATION DOSE REDUCTION: This exam was performed according to the
departmental dose-optimization program which includes automated
exposure control, adjustment of the mA and/or kV according to
patient size and/or use of iterative reconstruction technique.

CONTRAST:  75mL 9JNCT9-OYE IOPAMIDOL (9JNCT9-OYE) INJECTION 76%
FINDINGS: Cardiovascular: The main pulmonary artery is opacified up to 360
Hounsfield units. No filling defect is seen to indicate acute
pulmonary embolism.

Heart size is normal. No pericardial effusion. No thoracic aortic
aneurysm or aortic dissection. Mild atherosclerotic calcifications
within the aortic arch.

No arteriovenous fistula or malformation is identified.

Mediastinum/Nodes: No axillary, mediastinal, or hilar pathologically
enlarged lymph nodes. Small partially calcified right axillary lymph
node (axial series 5, image 36), likely the sequela of prior
granulomatous infection.

The visualized thyroid is unremarkable. The esophagus follows a
normal course of normal caliber.

Lungs/Pleura: The central airways are patent. No acute airspace
opacity or suspicious pulmonary nodule. No pleural effusion or
pneumothorax.

Upper Abdomen: No acute abnormality.

Musculoskeletal: No chest wall abnormality. No acute or significant
osseous findings.

Review of the MIP images confirms the above findings.
IMPRESSION: :
IMPRESSION: 1. No arteriovenous malformation is seen within the lungs.
2. No pulmonary embolism.
3. No acute lung process.

Aortic Atherosclerosis ([id number]).

## 2023-07-17 ENCOUNTER — Ambulatory Visit: Payer: BC Managed Care – PPO | Admitting: Neurology

## 2023-07-17 ENCOUNTER — Encounter: Payer: Self-pay | Admitting: Neurology

## 2023-07-17 VITALS — BP 134/83 | HR 79 | Ht 67.0 in | Wt 211.5 lb

## 2023-07-17 DIAGNOSIS — G4482 Headache associated with sexual activity: Secondary | ICD-10-CM | POA: Diagnosis not present

## 2023-07-17 DIAGNOSIS — G43019 Migraine without aura, intractable, without status migrainosus: Secondary | ICD-10-CM | POA: Diagnosis not present

## 2023-07-17 DIAGNOSIS — R9082 White matter disease, unspecified: Secondary | ICD-10-CM | POA: Diagnosis not present

## 2023-07-17 NOTE — Progress Notes (Signed)
[facility]  PATIENT: Crystal Heath DOB: November 14, 1980  REFERRING DOCTOR OR PCP: Carron Curie, PA-C SOURCE: Patient, notes from Dr. Anne Hahn and Margie Ege, NP, laboratory, imaging and procedure reports, MRI and CT imaging personally reviewed.  _________________________________   HISTORICAL  CHIEF COMPLAINT:  Chief Complaint  Patient presents with   Follow-up    Pt in room 10 alone. Here for Mirgaine follow up. Patient reports migraines are okay. Drinking more water. Averages about 1 every other month.     HISTORY OF PRESENT ILLNESS:  She is a 42 y.o.  woman with migraines and an abnormal MRI of the brain.Marland Kitchen  UPDTATE 07/12/2022: Migraines are doing well with only one every 2 months on average.    She is eating better, exercising more and losing weight (13 pounds).       She has not had any post-coital HA recently and no longer is taking an NSAID.     She denies any neurologic symptom such as numbness, visual changes.     When present, migraine quality is often pressure-like.   These occur a few times a month..  Most headaches are symmetric.  Often they improve by themself but sometimes takes Ibuprofen or Tylenol with benefit.     She denies aura.    No N/V.  She has mild photophobia.  Moving intensifies pain (especially bending over).    Vision is doing well for the most parts.   She will occasioanlly note diplopia lasting minutes to an hour or two.  No other neurologic symptoms.     MRIs of the brain have shown multiple white matter foci and a nonspecific pattern.  Not typical for demyelination.  She does not have vascular risks.   BP is usually fine.   Does not have DM or smoking history,  Due to additional visual symptoms and headache in March 2023,  we checked an echocardiogram of the heart.  It showed that some bubbles crossed over after 6 beats (more worrisome for pulmonary AVM rather than PFO).  CT angiogram of the chest did not show any evidence of  AVM or other process.    She has a ophthalmology exam from 12/21/2021 showed normal visual fields and exam.    Vascular risks: She does not smoke.  She does not have hypertension or diabetes.  She is on Crestor for elevated lipids.  No known cardiac disease.   To her knowledge, her delivery was normal.    She reached all milestones appropriatey.       Imaging: MRI of the brain 08/01/2021 was unchanged compared to the 07/08/2020 MRI.  MR Venogram of the 12/05/2021 was normal.  Incidental note of asymmetry of the sigmoid sinus and internal jugular vein, smaller on the left, normal.  MRI brain 07/08/2020 showed  multiple T2/flair hyperintense foci in the subcortical and deep white matter of the hemispheres.  This is a nonspecific finding and could be due to chronic microvascular ischemic change or the sequela of cardio-emboli, migraine, trauma, infection/inflammation/vasculitis.  Demyelination is unlikely to have this appearance.  Consider evaluation for PFO or other cardioembolic source.  There is a cavum septum pellucidum.     There are no acute findings and there is a normal enhancement pattern.  Compared to an MRI from 2019, there were no definite changes.  CT angiogram of the neck and head 01/01/2021 was normal.  Transcranial Dopplers 10/06/2020 was normal.  Bubble study was not done.  Cardiac evaluation: Echo ([facility]) 06/07/2020 showed  LVEF 60-65%.  Manual apical biplane EF 62.9%.   Normal diastolic function.   No significant valvular abnormalities are noted.   Pulmonary artery systolic pressure cannot be determined.  It does not appear as though bubble study was done.  Echo with bubble contrast 2023 showed some bubbles crossed over after 6 beat.    CT angiogram 12/11/2021 did not show any evidence of pulmonary AVM  48-hour Holter 06/19/2020 was negative.  Cardiac CT calcium score was 0.  Major arteries were normal.  Stress test 06/07/2020 was stopped due to chest pain though she did reach  102% maximum age-predicted heart rate  Laboratory: 01/03/2021: Lupus anticoagulant, factor V Leyden, protein C, protein S were negative or unremarkable 09/23/2018: ANCA, angiotensin-converting enzyme, Lyme antibodies, ANA, ESR, HIV, RPR were negative or unremarkable.  REVIEW OF SYSTEMS: Constitutional: No fevers, chills, sweats, or change in appetite Eyes: No visual changes, double vision, eye pain Ear, nose and throat: No hearing loss, ear pain, nasal congestion, sore throat Cardiovascular: No chest pain, palpitations Respiratory:  No shortness of breath at rest or with exertion.   No wheezes GastrointestinaI: No nausea, vomiting, diarrhea, abdominal pain, fecal incontinence Genitourinary:  No dysuria, urinary retention or frequency.  No nocturia. Musculoskeletal:  No neck pain, back pain Integumentary: No rash, pruritus, skin lesions Neurological: as above Psychiatric: No depression at this time.  No anxiety Endocrine: No palpitations, diaphoresis, change in appetite, change in weigh or increased thirst Hematologic/Lymphatic:  No anemia, purpura, petechiae. Allergic/Immunologic: No itchy/runny eyes, nasal congestion, recent allergic reactions, rashes  ALLERGIES: No Known Allergies  HOME MEDICATIONS:  Current Outpatient Medications:    indomethacin (INDOCIN) 25 MG capsule, One po qd prn headache, Disp: 30 capsule, Rfl: 3   levothyroxine (SYNTHROID) 75 MCG tablet, Take 75 mcg by mouth daily., Disp: , Rfl:    rosuvastatin (CRESTOR) 10 MG tablet, Take 10 mg by mouth at bedtime., Disp: , Rfl:   PAST MEDICAL HISTORY: Past Medical History:  Diagnosis Date   Asthma    Chronic insomnia 09/23/2018   Common migraine with intractable migraine 09/23/2018   Depression    High cholesterol    HSV-2 infection    Hypothyroidism     PAST SURGICAL HISTORY: Past Surgical History:  Procedure Laterality Date   DILATION AND CURETTAGE OF UTERUS     episiotomy repair      FAMILY  HISTORY: Family History  Problem Relation Age of Onset   Skin cancer Mother    Hypertension Mother    High Cholesterol Mother    High blood pressure Father    High Cholesterol Father    Congestive Heart Failure Maternal Grandmother    Breast cancer Paternal Grandmother    Alzheimer's disease Paternal Grandmother     SOCIAL HISTORY:  Social History   Socioeconomic History   Marital status: Married    Spouse name: Not on file   Number of children: Not on file   Years of education: Not on file   Highest education level: Not on file  Occupational History   Not on file  Tobacco Use   Smoking status: Never   Smokeless tobacco: Never  Substance and Sexual Activity   Alcohol use: Yes    Comment: 1-2 per drinks per week   Drug use: Never   Sexual activity: Not on file  Other Topics Concern   Not on file  Social History Narrative   1-2 cups of caffeine daily    Lives at home with spouse and three  children    Right handed    Social Determinants of Health   Financial Resource Strain: Low Risk  (01/08/2023)   Received from [facility]   Overall Financial Resource Strain (CARDIA)    Difficulty of Paying Living Expenses: Not hard at all  Food Insecurity: No Food Insecurity (01/08/2023)   [facility]   Hunger Vital Sign    Worried About Running Out of Food in the Last Year: Never true    Ran Out of Food in the Last Year: Never true  Transportation Needs: No Transportation Needs (01/08/2023)   Received from [facility] - Transportation    Lack of Transportation (Medical): No    Lack of Transportation (Non-Medical): No  Physical Activity: Insufficiently Active (09/22/2021)   Received from [facility], [facility]   Exercise Vital Sign    Days of Exercise per Week: 7 days    Minutes of Exercise per Session: 10 min  Stress: Stress Concern Present (09/22/2021)   Received from [facility], [facility]  - Occupational Stress Questionnaire    Feeling of Stress : Very much  Social Connections: Unknown (12/24/2021)   Received from [facility]   Social Network    Social Network: Not on file  Intimate Partner Violence: Unknown (11/30/2021)   Received from [facility]   HITS    Physically Hurt: Not on file    Insult or Talk Down To: Not on file    Threaten Physical Harm: Not on file    Scream or Curse: Not on file     PHYSICAL EXAM  Vitals:   07/17/23 0937 07/17/23 0944  BP: (!) 154/98 134/83  Pulse: 79   Weight: 211 lb 8 oz (95.9 kg)   Height: 5\' 7"  (1.702 m)     Body mass index is 33.13 kg/m.   General: The patient is well-developed and well-nourished and in no acute distress  HEENT:  Head is [place]/AT.  Sclera are anicteric.    Neurologic Exam  Mental status: The patient is alert and oriented x 3 at the time of the examination. The patient has apparent normal recent and remote memory, with an apparently normal attention span and concentration ability.   Speech is normal.  Cranial nerves: Extraocular movements are full.  Normal saccades.  The pupils are normal and symmetric.  Facial strength and sensation was normal.. No obvious hearing deficits are noted.  Motor:  Muscle bulk is normal.   Tone is normal. Strength is  5 / 5 in all 4 extremities.   Sensory: Sensory testing is intact to pinprick, soft touch and vibration sensation in all 4 extremities.  Coordination: Cerebellar testing reveals good finger-nose-finger and heel-to-shin bilaterally.  Gait and station: Station is normal.   Gait is normal. Tandem gait is normal.  Romberg is negative.  Reflexes: Deep tendon reflexes are symmetric and normal bilaterally.        ASSESSMENT AND PLAN  White matter abnormality on MRI of brain  Common migraine with intractable migraine  Coital headache   Migraines are doing better.  She will take an indomethacin or other NSAID if 1 occurs.   Stay active, continue to try to  eat well and exercise as tolerated.   She will return to see me as needed if there are new or worsening neurologic symptoms.  If any new symptoms consider repeat MRI   Courtnay Petrilla A. Epimenio Foot, MD, [facility] 07/17/2023, 10:31 AM Certified  in Neurology, Clinical Neurophysiology, Sleep Medicine and Neuroimaging  [facility] [address], [address] [place], [place] [address] [phone]
